# Patient Record
Sex: Male | Born: 1965
Health system: Southern US, Community
[De-identification: ages and names within clinical notes are randomized; demographics above are authoritative.]

## PROBLEM LIST (undated history)

## (undated) ENCOUNTER — Ambulatory Visit: Admission: EM | Source: Home / Self Care

## (undated) DIAGNOSIS — I4891 Unspecified atrial fibrillation: Secondary | ICD-10-CM

## (undated) DIAGNOSIS — Z789 Other specified health status: Secondary | ICD-10-CM

## (undated) DIAGNOSIS — K4090 Unilateral inguinal hernia, without obstruction or gangrene, not specified as recurrent: Secondary | ICD-10-CM

## (undated) HISTORY — PX: UPPER GI ENDOSCOPY: SHX6162

---

## 2005-04-03 HISTORY — PX: HERNIA REPAIR: SHX51

## 2005-12-17 ENCOUNTER — Encounter: Admission: RE | Admit: 2005-12-17 | Discharge: 2005-12-17 | Payer: Self-pay | Admitting: *Deleted

## 2006-02-07 ENCOUNTER — Encounter (INDEPENDENT_AMBULATORY_CARE_PROVIDER_SITE_OTHER): Payer: Self-pay | Admitting: Specialist

## 2006-02-07 ENCOUNTER — Ambulatory Visit (HOSPITAL_BASED_OUTPATIENT_CLINIC_OR_DEPARTMENT_OTHER): Admission: RE | Admit: 2006-02-07 | Discharge: 2006-02-07 | Payer: Self-pay | Admitting: Surgery

## 2007-09-01 ENCOUNTER — Encounter: Admission: RE | Admit: 2007-09-01 | Discharge: 2007-09-01 | Payer: Self-pay | Admitting: Diagnostic Radiology

## 2010-04-25 ENCOUNTER — Encounter: Payer: Self-pay | Admitting: Diagnostic Radiology

## 2010-08-19 NOTE — Op Note (Signed)
NAME:  Mark Fowler, Mark Fowler                ACCOUNT NO.:  [id number]   MEDICAL RECORD NO.:  [id number]          PATIENT TYPE:  AMB   LOCATION:  DSC                          FACILITY:  [facility]   PHYSICIAN:  Currie Paris, M.D.DATE OF BIRTH:  Feb 25, 1966   DATE OF PROCEDURE:  02/07/2006  DATE OF DISCHARGE:                                 OPERATIVE REPORT   PREOPERATIVE DIAGNOSIS:  Right inguinal hernia, probable indirect.   POSTOPERATIVE DIAGNOSIS:  Indirect right inguinal hernia.   PROCEDURE:  Repair of indirect right inguinal hernia with mesh.   SURGEON:  Currie Paris, M.D.   ANESTHESIA:  MAC.   CLINICAL HISTORY:  This patient presented with a recent onset of a right  inguinal bulge and hernia found on exam. He desired to proceed to have this  repaired.   DESCRIPTION OF PROCEDURE:  The patient was seen in the holding area and had  no further questions.  He confirmed that right inguinal hernia was the  operative plan.  Both he and I marked the right inguinal area as the  operative site.  The patient was then taken to the operating room and after  satisfactory IV sedation, the groin area was clipped, prepped and draped.  The time-out occurred.   1% Xylocaine with epi and 0.5% plain Marcaine were mixed equally and used  for local.  I infiltrated along the skin line, at right angles to it, both  transversely and vertically, and then above the anterior superior iliac  spine.  An oblique incision was made and deepened to the external oblique  aponeurosis with bleeders coagulated or tied with 4-0 Vicryl.  The external  oblique was cleaned off and then opened in the line of its fibers into the  superficial ring.  The cord was dissected up and elevated off of the cord  and surrounded with a Penrose drain.  What appeared to be the ilioinguinal  nerve was freed up and retracted temporarily superiorly.   As I cleaned off the cord, I saw that the floor was intact although very  thin.   There was a little bit of preperitoneal fat protruding through the  internal ring which was trimmed off and a small indirect sac which measured  about 3 cm by 4 cm.  This was twisted, suture ligated, amputated, and  retracted under the deep ring nicely.  I then took a piece of Marlex mesh  and cut it to form with a split laterally to go around the cord.  It was  sutured in starting at the pubic tubercle and using 2-0 Prolene to go from  there along the inferior edge until I got to the deep ring.  It lay well up  under the external oblique onto the internal oblique and was tacked with  several sutures there.  The opening for the cord structures was also used to  let the nerve come through so it would not be trapped by the mesh and I made  sure there was no mesh lying directly on it.  The tails went well lateral to  the  deep ring to give [place] a nice reconstruction of the deep ring.   Additional local had been infiltrated as I worked and the patient remained  comfortable.  I then close the external oblique with 3-0 Vicryl, Scarpa's  with 3-0 Vicryl, and the skin with 4-0 Monocryl subcuticular plus Dermabond.  The patient tolerated the procedure well.  There no operative complications.  All counts were correct.      Currie Paris, M.D.  Electronically Signed     CJS/MEDQ  D:  02/07/2006  T:  02/07/2006  Job:  [id number]

## 2012-05-24 ENCOUNTER — Other Ambulatory Visit (INDEPENDENT_AMBULATORY_CARE_PROVIDER_SITE_OTHER): Payer: Self-pay | Admitting: Surgery

## 2012-06-06 ENCOUNTER — Ambulatory Visit (INDEPENDENT_AMBULATORY_CARE_PROVIDER_SITE_OTHER): Payer: No Typology Code available for payment source | Admitting: Surgery

## 2012-06-06 ENCOUNTER — Encounter (INDEPENDENT_AMBULATORY_CARE_PROVIDER_SITE_OTHER): Payer: Self-pay | Admitting: Surgery

## 2012-06-06 VITALS — BP 120/82 | HR 60 | Temp 97.5°F | Resp 18 | Ht 72.0 in | Wt 169.0 lb

## 2012-06-06 DIAGNOSIS — K4090 Unilateral inguinal hernia, without obstruction or gangrene, not specified as recurrent: Secondary | ICD-10-CM

## 2012-06-06 HISTORY — DX: Unilateral inguinal hernia, without obstruction or gangrene, not specified as recurrent: K40.90

## 2012-06-06 NOTE — Progress Notes (Signed)
NAME: Mark Fowler DOB: 1965-11-03 MRN: [id number]                                                                                      DATE: 06/06/2012  PCP: Pearla Dubonnet, MD Referring Provider: No ref. provider found  IMPRESSION:  Left inguinial hernia  PLAN:   Repair - already scheduled. He understands and wishes toproceed                 CC:  Chief Complaint  Patient presents with  . Inguinal Hernia    left    HPI:  Mark Fowler is a 47 y.o.  male who presents for evaluation of LIH. It has been presnet several months and he now wishes to have it repaired. He notes that it is a bit larger than the one we repaired about seven years ago on the right side.  PMH:  has no past medical history on file.  PSH:   has no past surgical history on file.  ALLERGIES:  No Known Allergies  MEDICATIONS: Current outpatient prescriptions:Ibuprofen (ADVIL PO), Take by mouth as needed., Disp: , Rfl:   ROS: He has filled out our 12 point review of systems and it is negative . EXAM:   VITAL SIGNS: BP 120/82  Pulse 60  Temp(Src) 97.5 F (36.4 C) (Oral)  Resp 18  Ht 6' (1.829 m)  Wt 169 lb (76.658 kg)  BMI 22.92 kg/m2  GENERAL:  The patient is alert, oriented, and generally healthy-appearing, NAD. Mood and affect are normal.  HEENT:  The head is normocephalic, the eyes nonicteric, the pupils were round regular and equal. EOMs are normal. Pharynx normal. Dentition good.  NECK:  The neck is supple and there are no masses or thyromegaly.  LUNGS:  Normal respirations and clear to auscultation.  HEART:  Regular rhythm, with no murmurs rubs or gallops. Pulses are intact carotid dorsalis pedis and posterior tibial. No significant varicosities are noted.  ABDOMEN:  Soft, flat, and nontender. No masses or organomegaly is noted. No hernias are noted. Bowel sounds are normal.  GU:  Reducible left inguina hernia right ok  EXTREMITIES:  Good range of motion, no edema.   DATA  REVIEWED:  No new data    STRECK,CHRISTIAN J 06/06/2012  CC: No ref. provider found, GATES,ROBERT NEVILL, MD

## 2012-06-06 NOTE — Patient Instructions (Signed)
I will see you at surgery next month

## 2012-07-01 ENCOUNTER — Encounter (HOSPITAL_BASED_OUTPATIENT_CLINIC_OR_DEPARTMENT_OTHER): Payer: Self-pay | Admitting: *Deleted

## 2012-07-01 NOTE — Progress Notes (Signed)
No labs needed-radiologist

## 2012-07-04 ENCOUNTER — Encounter (HOSPITAL_BASED_OUTPATIENT_CLINIC_OR_DEPARTMENT_OTHER): Payer: Self-pay | Admitting: *Deleted

## 2012-07-04 ENCOUNTER — Encounter (HOSPITAL_BASED_OUTPATIENT_CLINIC_OR_DEPARTMENT_OTHER)
Admission: RE | Disposition: A | Discharge status: Home / Self Care | Payer: Self-pay | Source: Ambulatory Visit | Attending: Surgery

## 2012-07-04 ENCOUNTER — Ambulatory Visit (HOSPITAL_BASED_OUTPATIENT_CLINIC_OR_DEPARTMENT_OTHER)
Admission: RE | Admit: 2012-07-04 | Discharge: 2012-07-04 | Disposition: A | Discharge status: Home / Self Care | Payer: PRIVATE HEALTH INSURANCE | Source: Ambulatory Visit | Attending: Surgery | Admitting: Surgery

## 2012-07-04 ENCOUNTER — Ambulatory Visit (HOSPITAL_BASED_OUTPATIENT_CLINIC_OR_DEPARTMENT_OTHER): Payer: PRIVATE HEALTH INSURANCE | Admitting: *Deleted

## 2012-07-04 DIAGNOSIS — K4090 Unilateral inguinal hernia, without obstruction or gangrene, not specified as recurrent: Secondary | ICD-10-CM

## 2012-07-04 HISTORY — DX: Other specified health status: Z78.9

## 2012-07-04 HISTORY — PX: INGUINAL HERNIA REPAIR: SHX194

## 2012-07-04 HISTORY — DX: Unilateral inguinal hernia, without obstruction or gangrene, not specified as recurrent: K40.90

## 2012-07-04 LAB — POCT HEMOGLOBIN-HEMACUE: Hemoglobin: 16.3 g/dL (ref 13.0–17.0)

## 2012-07-04 SURGERY — REPAIR, HERNIA, INGUINAL, ADULT
Anesthesia: Regional | Site: Groin | Laterality: Left | Wound class: Clean

## 2012-07-04 MED ORDER — OXYCODONE HCL 5 MG PO TABS
5.0000 mg | ORAL_TABLET | Freq: Once | ORAL | Status: AC | PRN
  Administered 2012-07-04: 5 mg via ORAL

## 2012-07-04 MED ORDER — CEFAZOLIN SODIUM-DEXTROSE 2-3 GM-% IV SOLR
2.0000 g | INTRAVENOUS | Status: AC
  Administered 2012-07-04: 2 g via INTRAVENOUS

## 2012-07-04 MED ORDER — LACTATED RINGERS IV SOLN
INTRAVENOUS | Status: DC
  Administered 2012-07-04: 07:00:00 via INTRAVENOUS

## 2012-07-04 MED ORDER — CHLORHEXIDINE GLUCONATE 4 % EX LIQD: 1.0000 "application " | Freq: Once | CUTANEOUS | Status: DC

## 2012-07-04 MED ORDER — LIDOCAINE HCL (CARDIAC) 20 MG/ML IV SOLN
INTRAVENOUS | Status: DC | PRN
  Administered 2012-07-04: 60 mg via INTRAVENOUS

## 2012-07-04 MED ORDER — MIDAZOLAM HCL 2 MG/2ML IJ SOLN
1.0000 mg | INTRAMUSCULAR | Status: DC | PRN
  Administered 2012-07-04: 2 mg via INTRAVENOUS

## 2012-07-04 MED ORDER — DEXAMETHASONE SODIUM PHOSPHATE 4 MG/ML IJ SOLN
INTRAMUSCULAR | Status: DC | PRN
  Administered 2012-07-04: 10 mg via INTRAVENOUS

## 2012-07-04 MED ORDER — PROPOFOL 10 MG/ML IV BOLUS
INTRAVENOUS | Status: DC | PRN
  Administered 2012-07-04: 200 mg via INTRAVENOUS

## 2012-07-04 MED ORDER — HYDROMORPHONE HCL PF 1 MG/ML IJ SOLN: 0.2500 mg | INTRAMUSCULAR | Status: DC | PRN

## 2012-07-04 MED ORDER — ACETAMINOPHEN 10 MG/ML IV SOLN
1000.0000 mg | Freq: Once | INTRAVENOUS | Status: AC
  Administered 2012-07-04: 1000 mg via INTRAVENOUS

## 2012-07-04 MED ORDER — OXYCODONE HCL 5 MG/5ML PO SOLN: 5.0000 mg | Freq: Once | ORAL | Status: AC | PRN

## 2012-07-04 MED ORDER — BUPIVACAINE HCL (PF) 0.25 % IJ SOLN
INTRAMUSCULAR | Status: DC | PRN
  Administered 2012-07-04: 22 mL

## 2012-07-04 MED ORDER — FENTANYL CITRATE 0.05 MG/ML IJ SOLN
INTRAMUSCULAR | Status: DC | PRN
  Administered 2012-07-04: 100 ug via INTRAVENOUS

## 2012-07-04 MED ORDER — HYDROMORPHONE HCL 2 MG PO TABS: 2.0000 mg | ORAL_TABLET | ORAL | Status: DC | PRN

## 2012-07-04 MED ORDER — FENTANYL CITRATE 0.05 MG/ML IJ SOLN
50.0000 ug | INTRAMUSCULAR | Status: DC | PRN
  Administered 2012-07-04: 100 ug via INTRAVENOUS

## 2012-07-04 MED ORDER — BUPIVACAINE-EPINEPHRINE PF 0.5-1:200000 % IJ SOLN
INTRAMUSCULAR | Status: DC | PRN
  Administered 2012-07-04: 25 mL

## 2012-07-04 SURGICAL SUPPLY — 40 items
ADH SKN CLS APL DERMABOND .7 (GAUZE/BANDAGES/DRESSINGS) ×1
BLADE HEX COATED 2.75 (ELECTRODE) ×2 IMPLANT
BLADE SURG 10 STRL SS (BLADE) ×2 IMPLANT
BLADE SURG ROTATE 9660 (MISCELLANEOUS) ×2 IMPLANT
CHLORAPREP W/TINT 26ML (MISCELLANEOUS) ×2 IMPLANT
CLOTH BEACON ORANGE TIMEOUT ST (SAFETY) ×2 IMPLANT
COVER MAYO STAND STRL (DRAPES) ×2 IMPLANT
COVER TABLE BACK 60X90 (DRAPES) ×2 IMPLANT
DECANTER SPIKE VIAL GLASS SM (MISCELLANEOUS) IMPLANT
DERMABOND ADVANCED (GAUZE/BANDAGES/DRESSINGS) ×1
DERMABOND ADVANCED .7 DNX12 (GAUZE/BANDAGES/DRESSINGS) ×1 IMPLANT
DRAIN PENROSE 1/2X12 LTX STRL (WOUND CARE) ×2 IMPLANT
DRAPE LAPAROTOMY TRNSV 102X78 (DRAPE) ×2 IMPLANT
DRAPE UTILITY XL STRL (DRAPES) ×2 IMPLANT
ELECT REM PT RETURN 9FT ADLT (ELECTROSURGICAL) ×2
ELECTRODE REM PT RTRN 9FT ADLT (ELECTROSURGICAL) ×1 IMPLANT
GLOVE ECLIPSE 6.5 STRL STRAW (GLOVE) ×1 IMPLANT
GLOVE EUDERMIC 7 POWDERFREE (GLOVE) ×2 IMPLANT
GLOVE INDICATOR 6.5 STRL GRN (GLOVE) ×1 IMPLANT
GOWN PREVENTION PLUS XLARGE (GOWN DISPOSABLE) ×4 IMPLANT
MESH HERNIA 3X6 (Mesh General) ×1 IMPLANT
NDL HYPO 25X1 1.5 SAFETY (NEEDLE) ×1 IMPLANT
NEEDLE HYPO 22GX1.5 SAFETY (NEEDLE) ×2 IMPLANT
NEEDLE HYPO 25X1 1.5 SAFETY (NEEDLE) ×2 IMPLANT
NS IRRIG 1000ML POUR BTL (IV SOLUTION) ×1 IMPLANT
PACK BASIN DAY SURGERY FS (CUSTOM PROCEDURE TRAY) ×2 IMPLANT
PENCIL BUTTON HOLSTER BLD 10FT (ELECTRODE) ×2 IMPLANT
SLEEVE SCD COMPRESS KNEE MED (MISCELLANEOUS) ×1 IMPLANT
SPONGE INTESTINAL PEANUT (DISPOSABLE) ×2 IMPLANT
SPONGE LAP 4X18 X RAY DECT (DISPOSABLE) ×2 IMPLANT
SUT MNCRL AB 4-0 PS2 18 (SUTURE) ×2 IMPLANT
SUT PROLENE 2 0 CT2 30 (SUTURE) ×4 IMPLANT
SUT SILK 2 0 SH (SUTURE) IMPLANT
SUT VIC AB 3-0 CT1 27 (SUTURE) ×4
SUT VIC AB 3-0 CT1 27XBRD (SUTURE) ×2 IMPLANT
SUT VIC AB 4-0 BRD 54 (SUTURE) ×2 IMPLANT
SYR CONTROL 10ML LL (SYRINGE) ×2 IMPLANT
TAPE HYPAFIX 4 X10 (GAUZE/BANDAGES/DRESSINGS) IMPLANT
TOWEL OR 17X24 6PK STRL BLUE (TOWEL DISPOSABLE) ×2 IMPLANT
TOWEL OR NON WOVEN STRL DISP B (DISPOSABLE) ×2 IMPLANT

## 2012-07-04 NOTE — Op Note (Signed)
Mark Fowler 11/19/1965 [id number] 05/29/2012  Preoperative diagnosis: LIH, indirect  Postoperative diagnosis: Same  Procedure: Repair LIH with mesh  Surgeon: Currie Paris, MD, FACS  Anesthesia:General  Clinical History and Indications: This patient has a left inguinal hernia that he wishes to have repaired  Description of Procedure:The patient was seen in the holding area and the plans for the procedure as noted above confirmed with the patient. We reviewed again the risks and complications and the patient had no further questions. I then marked the left  as the operative side. This was confirmed with the patient. He wishes to proceed.  The patient was taken to the operating room and after satisfactory general anesthesia was obtained the left  inguinal area was prepped and draped as a sterile field. A time out was done.   A TAP block had been placed and I used some 0.25% plaine marcaine to help with postoperative pain management. The area of the incision was infiltrated first as well as a field block going medially and inferiorly  An oblique incision was made and deepened to the external oblique aponeurosis. Bleeders were either cauterized or tied with 3-0 Vicryl. The external oblique aponeurosis was opened in the line of its fibers and elevated off of the underlying tissue. The ilioinguinal nerve was noted and protected.  The spermatic cord was then dissected up off of the inguinal floor and surrounded with a drain. The ilioinguinal neerve was identified and kept with the cord. There was an indirect sac present, and there ws some thinning of the inguinal floor. The sac was dissected off the cord, opened, then tied with 4-0 Vircryl and amputated. It retracted under the deep ring.  I then took some Bard mesh and cut it to shape. It was anchored at the pubic tubercle and a running 2-0 Prolene used to suture it to the edge of the inferior shelving edge of the external oblique. It was  split laterally to go around the cord and then laid gently over the internal oblique medially. Several more sutures of 2-0 Prolene were used to anchor the mesh to the internal oblique. The tails of the mesh were crossed lateral to the cord structures and deep ring and tacked together.  This appeared to produce a nice coverage and repair with no tension. There was adequate space for the cord structures to exit through the mesh and deep ring.  I checked to make sure everything was dry. Additional local had been infiltrated as I was working to be sure we had complete anesthesia of the entire operative field.  The incision was then closed with a running 3-0 Vicryl on the external oblique, closing it over the repair. Scarpa's fascia was closed with a running 3-0 Vicryl and the skin with a running 4-0 Monocryl subcuticular and Dermabond on the skin.  The patient tolerated the procedure well. There were no operative complications. There was minimal blood loss. All counts were correct.  Currie Paris, MD, FACS 07/04/2012 8:30 AM

## 2012-07-04 NOTE — Anesthesia Procedure Notes (Addendum)
Anesthesia Regional Block:  TAP block  Pre-Anesthetic Checklist: ,, timeout performed, Correct Patient, Correct Site, Correct Laterality, Correct Procedure, Correct Position, site marked, Risks and benefits discussed, pre-op evaluation, post-op pain management  Laterality: Left  Prep: chloraprep       Needles:  Injection technique: Single-shot  Needle Type: Echogenic Stimulator Needle          Additional Needles:  Procedures: ultrasound guided (picture in chart) TAP block Narrative:  Start time: 07/04/2012 7:07 AM End time: 07/04/2012 7:15 AM Injection made incrementally with aspirations every 5 mL.  Performed by: Personally  Anesthesiologist: Fitzgerald,MD  Additional Notes: 2% Lidocaine for skin.   Procedure Name: LMA Insertion Date/Time: 07/04/2012 7:30 AM Performed by: Meyer Russel Pre-anesthesia Checklist: Patient identified, Emergency Drugs available, Suction available and Patient being monitored Patient Re-evaluated:Patient Re-evaluated prior to inductionOxygen Delivery Method: Circle System Utilized Preoxygenation: Pre-oxygenation with 100% oxygen Intubation Type: IV induction Ventilation: Mask ventilation without difficulty LMA: LMA inserted LMA Size: 5.0 Number of attempts: 1 Airway Equipment and Method: bite block Placement Confirmation: positive ETCO2 and breath sounds checked- equal and bilateral Tube secured with: Tape Dental Injury: Teeth and Oropharynx as per pre-operative assessment

## 2012-07-04 NOTE — Progress Notes (Signed)
Assisted Dr. Fitzgerald with left, ultrasound guided, transabdominal plane block. Side rails up, monitors on throughout procedure. See vital signs in flow sheet. Tolerated Procedure well. 

## 2012-07-04 NOTE — Interval H&P Note (Signed)
History and Physical Interval Note:  07/04/2012 7:18 AM  Mark Fowler  has presented today for surgery, with the diagnosis of left inguinal hernia   The various methods of treatment have been discussed with the patient and family. After consideration of risks, benefits and other options for treatment, the patient has consented to  Procedure(s): HERNIA REPAIR INGUINAL ADULT (Left) as a surgical intervention .  The patient's history has been reviewed, patient examined, no change in status, stable for surgery.  I have reviewed the patient's chart and labs.  Questions were answered to the patient's satisfaction.   TEP block is in, left inguinal area marked as the operative site  Shelbi Vaccaro J

## 2012-07-04 NOTE — Transfer of Care (Signed)
Immediate Anesthesia Transfer of Care Note  Patient: Mark Fowler  Procedure(s) Performed: Procedure(s): HERNIA REPAIR INGUINAL ADULT (Left)  Patient Location: PACU  Anesthesia Type:GA combined with regional for post-op pain  Level of Consciousness: awake, alert  and oriented  Airway & Oxygen Therapy: Patient Spontanous Breathing and Patient connected to face mask oxygen  Post-op Assessment: Report given to PACU RN, Post -op Vital signs reviewed and stable and Patient moving all extremities  Post vital signs: Reviewed and stable  Complications: No apparent anesthesia complications

## 2012-07-04 NOTE — H&P (View-Only) (Signed)
NAME: Mark Fowler DOB: 08-Apr-1965 MRN: [id number]                                                                                      DATE: 06/06/2012  PCP: Pearla Dubonnet, MD Referring Provider: No ref. provider found  IMPRESSION:  Left inguinial hernia  PLAN:   Repair - already scheduled. He understands and wishes toproceed                 CC:  Chief Complaint  Patient presents with  . Inguinal Hernia    left    HPI:  Mark Fowler is a 47 y.o.  male who presents for evaluation of LIH. It has been presnet several months and he now wishes to have it repaired. He notes that it is a bit larger than the one we repaired about seven years ago on the right side.  PMH:  has no past medical history on file.  PSH:   has no past surgical history on file.  ALLERGIES:  No Known Allergies  MEDICATIONS: Current outpatient prescriptions:Ibuprofen (ADVIL PO), Take by mouth as needed., Disp: , Rfl:   ROS: He has filled out our 12 point review of systems and it is negative . EXAM:   VITAL SIGNS: BP 120/82  Pulse 60  Temp(Src) 97.5 F (36.4 C) (Oral)  Resp 18  Ht 6' (1.829 m)  Wt 169 lb (76.658 kg)  BMI 22.92 kg/m2  GENERAL:  The patient is alert, oriented, and generally healthy-appearing, NAD. Mood and affect are normal.  HEENT:  The head is normocephalic, the eyes nonicteric, the pupils were round regular and equal. EOMs are normal. Pharynx normal. Dentition good.  NECK:  The neck is supple and there are no masses or thyromegaly.  LUNGS:  Normal respirations and clear to auscultation.  HEART:  Regular rhythm, with no murmurs rubs or gallops. Pulses are intact carotid dorsalis pedis and posterior tibial. No significant varicosities are noted.  ABDOMEN:  Soft, flat, and nontender. No masses or organomegaly is noted. No hernias are noted. Bowel sounds are normal.  GU:  Reducible left inguina hernia right ok  EXTREMITIES:  Good range of motion, no edema.   DATA  REVIEWED:  No new data    STRECK,CHRISTIAN J 06/06/2012  CC: No ref. provider found, GATES,ROBERT NEVILL, MD

## 2012-07-04 NOTE — Anesthesia Postprocedure Evaluation (Signed)
  Anesthesia Post-op Note  Patient: Mark Fowler  Procedure(s) Performed: Procedure(s): HERNIA REPAIR INGUINAL ADULT (Left)  Patient Location: PACU  Anesthesia Type:GA combined with regional for post-op pain  Level of Consciousness: awake, alert  and oriented  Airway and Oxygen Therapy: Patient Spontanous Breathing  Post-op Pain: none  Post-op Assessment: Post-op Vital signs reviewed, Patient's Cardiovascular Status Stable, Respiratory Function Stable, Patent Airway and No signs of Nausea or vomiting  Post-op Vital Signs: Reviewed and stable  Complications: No apparent anesthesia complications

## 2012-07-04 NOTE — Anesthesia Preprocedure Evaluation (Addendum)
Anesthesia Evaluation  Patient identified by MRN, date of birth, ID band Patient awake    Reviewed: Allergy & Precautions, H&P , NPO status , Patient's Chart, lab work & pertinent test results  Airway Mallampati: II TM Distance: >3 FB Neck ROM: Full    Dental no notable dental hx. (+) Teeth Intact and Dental Advisory Given   Pulmonary neg pulmonary ROS,  breath sounds clear to auscultation  Pulmonary exam normal       Cardiovascular negative cardio ROS  Rhythm:Regular Rate:Normal     Neuro/Psych negative neurological ROS  negative psych ROS   GI/Hepatic negative GI ROS, Neg liver ROS,   Endo/Other  negative endocrine ROS  Renal/GU negative Renal ROS  negative genitourinary   Musculoskeletal   Abdominal   Peds  Hematology negative hematology ROS (+)   Anesthesia Other Findings   Reproductive/Obstetrics negative OB ROS                          Anesthesia Physical Anesthesia Plan  ASA: I  Anesthesia Plan: General and Regional   Post-op Pain Management:    Induction: Intravenous  Airway Management Planned: LMA  Additional Equipment:   Intra-op Plan:   Post-operative Plan: Extubation in OR  Informed Consent: I have reviewed the patients History and Physical, chart, labs and discussed the procedure including the risks, benefits and alternatives for the proposed anesthesia with the patient or authorized representative who has indicated his/her understanding and acceptance.   Dental advisory given  Plan Discussed with: CRNA  Anesthesia Plan Comments:         Anesthesia Quick Evaluation  

## 2012-07-05 ENCOUNTER — Encounter (HOSPITAL_BASED_OUTPATIENT_CLINIC_OR_DEPARTMENT_OTHER): Payer: Self-pay | Admitting: Surgery

## 2012-07-26 ENCOUNTER — Encounter (INDEPENDENT_AMBULATORY_CARE_PROVIDER_SITE_OTHER): Payer: No Typology Code available for payment source | Admitting: Surgery

## 2012-07-30 ENCOUNTER — Ambulatory Visit (INDEPENDENT_AMBULATORY_CARE_PROVIDER_SITE_OTHER): Payer: No Typology Code available for payment source | Admitting: Surgery

## 2012-07-30 ENCOUNTER — Encounter (INDEPENDENT_AMBULATORY_CARE_PROVIDER_SITE_OTHER): Payer: Self-pay | Admitting: Surgery

## 2012-07-30 VITALS — BP 118/82 | HR 65 | Temp 98.0°F | Resp 18 | Ht 72.0 in | Wt 175.0 lb

## 2012-07-30 DIAGNOSIS — Z09 Encounter for follow-up examination after completed treatment for conditions other than malignant neoplasm: Secondary | ICD-10-CM

## 2012-07-30 NOTE — Patient Instructions (Signed)
We will see you again on an as needed basis. Please call the office at [phone] if you have any questions or concerns. Thank you for allowing us to take care of you.  

## 2012-07-30 NOTE — Progress Notes (Signed)
NAME: Mark Fowler                                            DOB: May 19, 1965 DATE: 07/30/2012                                                  MRN: [id number]  CC:  Chief Complaint  Patient presents with  . Routine Post Op    L/H    HPI: This patient comes in for post op follow-up .Heunderwent left inguinal hernia repair on 07/05/2011. He feels that he is doing well.  PE:  VITAL SIGNS: BP 118/82  Pulse 65  Temp(Src) 98 F (36.7 C)  Resp 18  Ht 6' (1.829 m)  Wt 175 lb (79.379 kg)  BMI 23.73 kg/m2  General: The patient appears to be healthy, NAD Incision: Incision is healing nicely. Repair is solid. The testis is normal.  IMPRESSION: The patient is doing well S/P repair of a left inguinal hernia.    PLAN: I will see him back when necessary. He'll gradually increase activity to a normal level.

## 2013-02-06 ENCOUNTER — Other Ambulatory Visit: Payer: Self-pay

## 2014-01-16 ENCOUNTER — Other Ambulatory Visit: Payer: Self-pay

## 2014-02-17 ENCOUNTER — Other Ambulatory Visit: Payer: Self-pay | Admitting: Plastic Surgery

## 2017-11-30 ENCOUNTER — Other Ambulatory Visit: Payer: Self-pay

## 2017-11-30 ENCOUNTER — Encounter (HOSPITAL_BASED_OUTPATIENT_CLINIC_OR_DEPARTMENT_OTHER): Payer: Self-pay | Admitting: Emergency Medicine

## 2017-11-30 ENCOUNTER — Emergency Department (HOSPITAL_BASED_OUTPATIENT_CLINIC_OR_DEPARTMENT_OTHER)
Admission: EM | Admit: 2017-11-30 | Discharge: 2017-11-30 | Disposition: A | Discharge status: Home / Self Care | Payer: No Typology Code available for payment source | Attending: Emergency Medicine | Admitting: Emergency Medicine

## 2017-11-30 DIAGNOSIS — M79605 Pain in left leg: Secondary | ICD-10-CM | POA: Diagnosis present

## 2017-11-30 DIAGNOSIS — M5432 Sciatica, left side: Secondary | ICD-10-CM | POA: Insufficient documentation

## 2017-11-30 MED ORDER — CYCLOBENZAPRINE HCL 10 MG PO TABS: 10.0000 mg | ORAL_TABLET | Freq: Three times a day (TID) | ORAL | 0 refills | Status: DC | PRN

## 2017-11-30 MED ORDER — PREDNISONE 10 MG PO TABS: 20.0000 mg | ORAL_TABLET | Freq: Two times a day (BID) | ORAL | 0 refills | Status: DC

## 2017-11-30 NOTE — Discharge Instructions (Addendum)
Prednisone as prescribed.  Ibuprofen 600 mg every 6 hours as needed for pain.  Flexeril as prescribed as needed for pain not relieved with ibuprofen.  All up with your primary doctor if symptoms are not improving in the next week to discuss physical therapy or possible imaging studies.

## 2017-11-30 NOTE — ED Provider Notes (Signed)
[facility] EMERGENCY DEPARTMENT Provider Note   CSN: [id number] Arrival date & time: 11/30/17  0536     History   Chief Complaint Chief Complaint  Patient presents with  . Leg Pain    HPI Mark Fowler is a 52 y.o. male.  Patient is a 52 year old male with no significant past medical history.  He presents today with complaints of pain in his left buttock and left leg.  This is been occurring intermittently for the past several days, however became severe this morning.  He describes pain that starts in his left buttock and radiates into his left leg.  He denies any specific injury or trauma.  He denies any bowel or bladder complaints and denies any weakness.  He took Flexeril that he had leftover from a prior prescription which gave him some relief.  He reports a similar episode in the same leg last fall which resolved spontaneously, however was not quite this bad.  The history is provided by the patient.  Leg Pain   This is a new problem. Episode onset: Several days ago. The problem occurs constantly. The problem has been rapidly worsening. Pain location: Left buttock and left leg. The pain is severe. Associated symptoms include limited range of motion and stiffness. Pertinent negatives include no numbness and no tingling. Treatments tried: Advil and Flexeril. The treatment provided moderate relief.    Past Medical History:  Diagnosis Date  . Left inguinal hernia 06/06/2012   Repaired 07/04/12   . Medical history non-contributory     There are no active problems to display for this patient.   Past Surgical History:  Procedure Laterality Date  . HERNIA REPAIR  2007   rt ing hernia with mesh  . INGUINAL HERNIA REPAIR Left 07/04/2012   Procedure: HERNIA REPAIR INGUINAL ADULT;  Surgeon: Currie Paris, MD;  Location: [facility];  Service: General;  Laterality: Left;  . UPPER GI ENDOSCOPY          Home Medications    Prior to Admission medications    Medication Sig Start Date End Date Taking? Authorizing Provider  desloratadine-pseudoephedrine (CLARINEX-D 12-HOUR) 2.5-120 MG per tablet Take 1 tablet by mouth 2 (two) times daily.    [provider]  HYDROmorphone (DILAUDID) 2 MG tablet Take 1 tablet (2 mg total) by mouth every 4 (four) hours as needed for pain. Take as needed for pain 07/04/12   Cyndia Bent, MD  Ibuprofen (ADVIL PO) Take by mouth as needed.    [provider]    Family History History reviewed. No pertinent family history.  Social History Social History   Tobacco Use  . Smoking status: Never Smoker  . Smokeless tobacco: Never Used  Substance Use Topics  . Alcohol use: Yes    Alcohol/week: 7.0 - 14.0 standard drinks    Types: 7 - 14 Standard drinks or equivalent per week  . Drug use: No     Allergies   Patient has no known allergies.   Review of Systems Review of Systems  Musculoskeletal: Positive for stiffness.  Neurological: Negative for tingling and numbness.  All other systems reviewed and are negative.    Physical Exam Updated Vital Signs BP 133/90 (BP Location: Left Arm)   Pulse 70   Temp 98.4 F (36.9 C) (Oral)   Resp 16   Ht 6' (1.829 m)   Wt 75.8 kg   SpO2 100%   BMI 22.65 kg/m   Physical Exam  Constitutional: He  is oriented to person, place, and time. He appears well-developed and well-nourished. No distress.  HENT:  Head: Normocephalic and atraumatic.  Neck: Normal range of motion. Neck supple.  Pulmonary/Chest: Effort normal.  Musculoskeletal:  There is tenderness to palpation in the left buttock.  There is no L-spine tenderness or tenderness in the soft tissues of the lumbar region.  Neurological: He is alert and oriented to person, place, and time.  DTRs are 2+ and symmetrical in the patellar and Achilles tendons bilaterally.  Strength is 5 out of 5 in both lower extremities.  He is able to walk on his heels and toes without difficulty.  Skin: Skin is  warm and dry. He is not diaphoretic.  Nursing note and vitals reviewed.    ED Treatments / Results  Labs (all labs ordered are listed, but only abnormal results are displayed) Labs Reviewed - No data to display  EKG None  Radiology No results found.  Procedures Procedures (including critical care time)  Medications Ordered in ED Medications - No data to display   Initial Impression / Assessment and Plan / ED Course  I have reviewed the triage vital signs and the nursing notes.  Pertinent labs & imaging results that were available during my care of the patient were reviewed by me and considered in my medical decision making (see chart for details).  This appears to be sciatica.  He has no findings on exam and there are no aspects of his history that raise any red flags that there is an emergently surgical condition present.  He is feeling better after taking Flexeril prior to coming here.  He will be discharged with prednisone and prescribed additional Flexeril.  He is to follow-up with his primary doctor if symptoms persist to discuss imaging studies or physical therapy.  Final Clinical Impressions(s) / ED Diagnoses   Final diagnoses:  None    ED Discharge Orders    None       Geoffery Lyonselo, Trayonna Bachmeier, MD 11/30/17 [phone]

## 2017-11-30 NOTE — ED Triage Notes (Signed)
Patient co left buttock pain radiating into left lower extremity; states intermittent numbness and cooling sensations in left lower extremity. States took tylenol and flexeril at 0100 and ibuprofen at 0300; states pain is relieved at this time.

## 2017-12-04 ENCOUNTER — Ambulatory Visit
Admission: RE | Admit: 2017-12-04 | Discharge: 2017-12-04 | Disposition: A | Discharge status: Home / Self Care | Payer: No Typology Code available for payment source | Source: Ambulatory Visit | Attending: Neurosurgery | Admitting: Neurosurgery

## 2017-12-04 ENCOUNTER — Other Ambulatory Visit: Payer: Self-pay | Admitting: Neurosurgery

## 2017-12-04 DIAGNOSIS — M545 Low back pain, unspecified: Secondary | ICD-10-CM

## 2017-12-07 ENCOUNTER — Other Ambulatory Visit: Payer: Self-pay | Admitting: Neurosurgery

## 2017-12-07 DIAGNOSIS — M5126 Other intervertebral disc displacement, lumbar region: Secondary | ICD-10-CM

## 2017-12-11 ENCOUNTER — Other Ambulatory Visit: Payer: Self-pay | Admitting: Neurosurgery

## 2017-12-11 ENCOUNTER — Ambulatory Visit
Admission: RE | Admit: 2017-12-11 | Discharge: 2017-12-11 | Disposition: A | Discharge status: Home / Self Care | Payer: No Typology Code available for payment source | Source: Ambulatory Visit | Attending: Neurosurgery | Admitting: Neurosurgery

## 2017-12-11 DIAGNOSIS — M5126 Other intervertebral disc displacement, lumbar region: Secondary | ICD-10-CM

## 2017-12-11 MED ORDER — IOPAMIDOL (ISOVUE-M 200) INJECTION 41%
1.0000 mL | Freq: Once | INTRAMUSCULAR | Status: AC
  Administered 2017-12-11: 1 mL via EPIDURAL

## 2017-12-11 MED ORDER — METHYLPREDNISOLONE ACETATE 40 MG/ML INJ SUSP (RADIOLOG
120.0000 mg | Freq: Once | INTRAMUSCULAR | Status: AC
  Administered 2017-12-11: 120 mg via EPIDURAL

## 2017-12-11 NOTE — Discharge Instructions (Signed)

## 2018-06-11 ENCOUNTER — Other Ambulatory Visit: Payer: Self-pay | Admitting: Internal Medicine

## 2018-06-11 ENCOUNTER — Ambulatory Visit
Admission: RE | Admit: 2018-06-11 | Discharge: 2018-06-11 | Disposition: A | Discharge status: Home / Self Care | Payer: BLUE CROSS/BLUE SHIELD | Source: Ambulatory Visit | Attending: Internal Medicine | Admitting: Internal Medicine

## 2018-06-11 DIAGNOSIS — M79672 Pain in left foot: Secondary | ICD-10-CM

## 2018-06-11 DIAGNOSIS — S99922A Unspecified injury of left foot, initial encounter: Secondary | ICD-10-CM | POA: Diagnosis not present

## 2018-08-19 ENCOUNTER — Emergency Department (HOSPITAL_BASED_OUTPATIENT_CLINIC_OR_DEPARTMENT_OTHER)
Admission: EM | Admit: 2018-08-19 | Discharge: 2018-08-19 | Disposition: A | Discharge status: Home / Self Care | Payer: BLUE CROSS/BLUE SHIELD | Attending: Emergency Medicine | Admitting: Emergency Medicine

## 2018-08-19 ENCOUNTER — Emergency Department (HOSPITAL_BASED_OUTPATIENT_CLINIC_OR_DEPARTMENT_OTHER): Payer: BLUE CROSS/BLUE SHIELD

## 2018-08-19 ENCOUNTER — Encounter (HOSPITAL_BASED_OUTPATIENT_CLINIC_OR_DEPARTMENT_OTHER): Payer: Self-pay | Admitting: Emergency Medicine

## 2018-08-19 ENCOUNTER — Other Ambulatory Visit: Payer: Self-pay

## 2018-08-19 DIAGNOSIS — N23 Unspecified renal colic: Secondary | ICD-10-CM | POA: Diagnosis not present

## 2018-08-19 DIAGNOSIS — N200 Calculus of kidney: Secondary | ICD-10-CM | POA: Insufficient documentation

## 2018-08-19 DIAGNOSIS — Z79899 Other long term (current) drug therapy: Secondary | ICD-10-CM | POA: Insufficient documentation

## 2018-08-19 DIAGNOSIS — R1011 Right upper quadrant pain: Secondary | ICD-10-CM | POA: Diagnosis not present

## 2018-08-19 DIAGNOSIS — N1330 Unspecified hydronephrosis: Secondary | ICD-10-CM | POA: Diagnosis not present

## 2018-08-19 LAB — CBC WITH DIFFERENTIAL/PLATELET
Abs Immature Granulocytes: 0.03 10*3/uL (ref 0.00–0.07)
Basophils Absolute: 0.1 10*3/uL (ref 0.0–0.1)
Basophils Relative: 1 %
Eosinophils Absolute: 0.3 10*3/uL (ref 0.0–0.5)
Eosinophils Relative: 4 %
HCT: 46.6 % (ref 39.0–52.0)
Hemoglobin: 15.4 g/dL (ref 13.0–17.0)
Immature Granulocytes: 0 %
Lymphocytes Relative: 21 %
Lymphs Abs: 1.6 10*3/uL (ref 0.7–4.0)
MCH: 31 pg (ref 26.0–34.0)
MCHC: 33 g/dL (ref 30.0–36.0)
MCV: 93.8 fL (ref 80.0–100.0)
Monocytes Absolute: 0.4 10*3/uL (ref 0.1–1.0)
Monocytes Relative: 5 %
Neutro Abs: 5.2 10*3/uL (ref 1.7–7.7)
Neutrophils Relative %: 69 %
Platelets: 201 10*3/uL (ref 150–400)
RBC: 4.97 MIL/uL (ref 4.22–5.81)
RDW: 12.2 % (ref 11.5–15.5)
WBC: 7.5 10*3/uL (ref 4.0–10.5)
nRBC: 0 % (ref 0.0–0.2)

## 2018-08-19 LAB — BASIC METABOLIC PANEL
Anion gap: 10 (ref 5–15)
BUN: 20 mg/dL (ref 6–20)
CO2: 24 mmol/L (ref 22–32)
Calcium: 9 mg/dL (ref 8.9–10.3)
Chloride: 107 mmol/L (ref 98–111)
Creatinine, Ser: 0.91 mg/dL (ref 0.61–1.24)
GFR calc Af Amer: >60 mL/min (ref >60)
GFR calc non Af Amer: >60 mL/min (ref >60)
Glucose, Bld: 124 mg/dL — ABNORMAL HIGH (ref 70–99)
Potassium: 3.4 mmol/L — ABNORMAL LOW (ref 3.5–5.1)
Sodium: 141 mmol/L (ref 135–145)

## 2018-08-19 LAB — URINALYSIS, ROUTINE W REFLEX MICROSCOPIC
Bilirubin Urine: NEGATIVE
Glucose, UA: NEGATIVE mg/dL
Ketones, ur: NEGATIVE mg/dL
Leukocytes,Ua: NEGATIVE
Nitrite: NEGATIVE
Protein, ur: NEGATIVE mg/dL
Specific Gravity, Urine: >1.03 — ABNORMAL HIGH (ref 1.005–1.030)
pH: 5.5 (ref 5.0–8.0)

## 2018-08-19 LAB — URINALYSIS, MICROSCOPIC (REFLEX)

## 2018-08-19 MED ORDER — TAMSULOSIN HCL 0.4 MG PO CAPS: 0.4000 mg | ORAL_CAPSULE | Freq: Every day | ORAL | 0 refills | Status: DC

## 2018-08-19 MED ORDER — IBUPROFEN 600 MG PO TABS: 600.0000 mg | ORAL_TABLET | Freq: Four times a day (QID) | ORAL | 0 refills | Status: DC | PRN

## 2018-08-19 MED ORDER — HYDROCODONE-ACETAMINOPHEN 5-325 MG PO TABS: 1.0000 | ORAL_TABLET | Freq: Four times a day (QID) | ORAL | 0 refills | Status: DC | PRN

## 2018-08-19 MED ORDER — ONDANSETRON HCL 4 MG/2ML IJ SOLN
4.0000 mg | Freq: Once | INTRAMUSCULAR | Status: AC
  Administered 2018-08-19: 06:00:00 4 mg via INTRAVENOUS
  Filled 2018-08-19: qty 2

## 2018-08-19 MED ORDER — KETOROLAC TROMETHAMINE 30 MG/ML IJ SOLN
30.0000 mg | Freq: Once | INTRAMUSCULAR | Status: AC
  Administered 2018-08-19: 06:00:00 30 mg via INTRAVENOUS
  Filled 2018-08-19: qty 1

## 2018-08-19 MED ORDER — SODIUM CHLORIDE 0.9 % IV BOLUS
1000.0000 mL | Freq: Once | INTRAVENOUS | Status: AC
  Administered 2018-08-19: 1000 mL via INTRAVENOUS

## 2018-08-19 MED ORDER — ONDANSETRON 4 MG PO TBDP: 4.0000 mg | ORAL_TABLET | Freq: Three times a day (TID) | ORAL | 0 refills | Status: DC | PRN

## 2018-08-19 NOTE — ED Notes (Signed)
Pt understood dc material. NAD noted. Scripts given at dc. All questions answered to satisfaction. Pt escorted to check out counter. 

## 2018-08-19 NOTE — ED Notes (Signed)
ED Provider at bedside. 

## 2018-08-19 NOTE — ED Triage Notes (Signed)
Pt states he woke up at 0340 and urinated. At 0400 he felt that he needed to pee. Then right flank pain started. Felt nauseous and sweaty.

## 2018-08-19 NOTE — ED Provider Notes (Signed)
[facility] EMERGENCY DEPARTMENT Provider Note   CSN: [id number] Arrival date & time: 08/19/18  0549    History   Chief Complaint Chief Complaint  Patient presents with  . Flank Pain    HPI Mark Fowler is a 53 y.o. male.     HPI  This is a 53 year old male with no significant past medical history who presents with right flank pain.  Patient reports that he got up this morning to urinate.  He went back to bed and had a sudden onset of right flank pain and urinary urgency.  He states the pain comes and goes.  Nothing seems to make it better or worse.  It did begin to radiate into his right testicle.  At times the pain is as high as 7 out of 10.  Currently the pain is 3 out of 10.  He did not take anything for the pain.  He reports nausea without vomiting.  Denies fevers or dysuria.  No history of kidney stones.  Of note, patient is a Occupational psychologistlocal radiologist.  Past Medical History:  Diagnosis Date  . Left inguinal hernia 06/06/2012   Repaired 07/04/12   . Medical history non-contributory     There are no active problems to display for this patient.   Past Surgical History:  Procedure Laterality Date  . HERNIA REPAIR  2007   rt ing hernia with mesh  . INGUINAL HERNIA REPAIR Left 07/04/2012   Procedure: HERNIA REPAIR INGUINAL ADULT;  Surgeon: Currie Parishristian J Streck, MD;  Location: [facility];  Service: General;  Laterality: Left;  . UPPER GI ENDOSCOPY          Home Medications    Prior to Admission medications   Medication Sig Start Date End Date Taking? Authorizing Provider  cyclobenzaprine (FLEXERIL) 10 MG tablet Take 1 tablet (10 mg total) by mouth 3 (three) times daily as needed for muscle spasms. 11/30/17   Geoffery Lyonselo, Douglas, MD  desloratadine-pseudoephedrine (CLARINEX-D 12-HOUR) 2.5-120 MG per tablet Take 1 tablet by mouth 2 (two) times daily.    [provider]  HYDROcodone-acetaminophen (NORCO/VICODIN) 5-325 MG tablet Take 1 tablet by mouth every 6  (six) hours as needed. 08/19/18   Horton, Mayer Maskerourtney F, MD  HYDROmorphone (DILAUDID) 2 MG tablet Take 1 tablet (2 mg total) by mouth every 4 (four) hours as needed for pain. Take as needed for pain 07/04/12   Cyndia BentStreck, Christian, MD  ibuprofen (ADVIL) 600 MG tablet Take 1 tablet (600 mg total) by mouth every 6 (six) hours as needed. 08/19/18   Horton, Mayer Maskerourtney F, MD  ondansetron (ZOFRAN ODT) 4 MG disintegrating tablet Take 1 tablet (4 mg total) by mouth every 8 (eight) hours as needed. 08/19/18   Horton, Mayer Maskerourtney F, MD  predniSONE (DELTASONE) 10 MG tablet Take 2 tablets (20 mg total) by mouth 2 (two) times daily. 11/30/17   Geoffery Lyonselo, Douglas, MD  tamsulosin (FLOMAX) 0.4 MG CAPS capsule Take 1 capsule (0.4 mg total) by mouth daily. 08/19/18   Horton, Mayer Maskerourtney F, MD    Family History No family history on file.  Social History Social History   Tobacco Use  . Smoking status: Never Smoker  . Smokeless tobacco: Never Used  Substance Use Topics  . Alcohol use: Yes    Alcohol/week: 7.0 - 14.0 standard drinks    Types: 7 - 14 Standard drinks or equivalent per week  . Drug use: No     Allergies   Patient has no known allergies.  Review of Systems Review of Systems  Constitutional: Negative for fever.  Respiratory: Negative for shortness of breath.   Cardiovascular: Negative for chest pain.  Gastrointestinal: Positive for nausea. Negative for vomiting.  Genitourinary: Positive for flank pain. Negative for dysuria and hematuria.  Musculoskeletal: Negative for back pain.  Skin: Negative for rash.  Neurological: Negative for headaches.  All other systems reviewed and are negative.    Physical Exam Updated Vital Signs BP 137/89 (BP Location: Right Arm)   Pulse (!) 58   Temp (!) 97.4 F (36.3 C) (Oral)   Resp 16   Ht 1.829 m (6')   Wt 74.4 kg   SpO2 100%   BMI 22.24 kg/m   Physical Exam Vitals signs and nursing note reviewed.  Constitutional:      Appearance: Normal appearance. He is  well-developed.  HENT:     Head: Normocephalic and atraumatic.     Mouth/Throat:     Mouth: Mucous membranes are moist.  Cardiovascular:     Rate and Rhythm: Normal rate and regular rhythm.     Heart sounds: Normal heart sounds. No murmur.  Pulmonary:     Effort: Pulmonary effort is normal. No respiratory distress.     Breath sounds: Normal breath sounds. No wheezing.  Abdominal:     Palpations: Abdomen is soft.     Tenderness: There is no abdominal tenderness. There is right CVA tenderness. There is no guarding or rebound.  Musculoskeletal:     Right lower leg: No edema.     Left lower leg: No edema.  Skin:    General: Skin is warm and dry.  Neurological:     Mental Status: He is alert and oriented to person, place, and time.  Psychiatric:        Mood and Affect: Mood normal.      ED Treatments / Results  Labs (all labs ordered are listed, but only abnormal results are displayed) Labs Reviewed  URINALYSIS, ROUTINE W REFLEX MICROSCOPIC - Abnormal; Notable for the following components:      Result Value   Specific Gravity, Urine >1.030 (*)    Hgb urine dipstick LARGE (*)    All other components within normal limits  BASIC METABOLIC PANEL - Abnormal; Notable for the following components:   Potassium 3.4 (*)    Glucose, Bld 124 (*)    All other components within normal limits  URINALYSIS, MICROSCOPIC (REFLEX) - Abnormal; Notable for the following components:   Bacteria, UA FEW (*)    All other components within normal limits  CBC WITH DIFFERENTIAL/PLATELET    EKG None  Radiology Ct Renal Stone Study  Result Date: 08/19/2018 CLINICAL DATA:  Flank pain with stone disease suspected EXAM: CT ABDOMEN AND PELVIS WITHOUT CONTRAST TECHNIQUE: Multidetector CT imaging of the abdomen and pelvis was performed following the standard protocol without IV contrast. COMPARISON:  None. FINDINGS: Lower chest:  No contributory findings. Hepatobiliary: No focal liver abnormality.No  evidence of biliary obstruction or stone. Pancreas: Unremarkable. Spleen: Unremarkable. Adrenals/Urinary Tract: Negative adrenals. Right hydronephrosis and hydroureter due to a 2 mm stone at the right UVJ. Three or four punctate renal calculi on the left, see coronal reformats. Negative bladder Stomach/Bowel:  No obstruction. No appendicitis. Vascular/Lymphatic: No acute vascular abnormality. Retroaortic left renal vein. No mass or adenopathy. Reproductive:Nonspecific prosthetic calcification. Other: No ascites or pneumoperitoneum. Musculoskeletal: No acute abnormalities. IMPRESSION: 1. Right hydronephrosis from a 2 mm UVJ calculus. 2. Three or four punctate left renal calculi. Electronically Signed  By: Marnee Spring M.D.   On: 08/19/2018 06:45    Procedures Procedures (including critical care time)  Medications Ordered in ED Medications  sodium chloride 0.9 % bolus 1,000 mL (0 mLs Intravenous Stopped 08/19/18 0700)  ondansetron (ZOFRAN) injection 4 mg (4 mg Intravenous Given 08/19/18 0615)  ketorolac (TORADOL) 30 MG/ML injection 30 mg (30 mg Intravenous Given 08/19/18 0615)     Initial Impression / Assessment and Plan / ED Course  I have reviewed the triage vital signs and the nursing notes.  Pertinent labs & imaging results that were available during my care of the patient were reviewed by me and considered in my medical decision making (see chart for details).        Patient presents with acute onset of right flank pain.  He is otherwise nontoxic-appearing and vital signs are reassuring.  He is afebrile.  History is highly suggestive of kidney stone.  UTI is also consideration.  Less likely appendicitis.  Patient was given Toradol and fluids as well as Zofran.  He does have large hemoglobin in his urine.  CT scan shows a 2 mm UVJ stone with hydronephrosis.  I discussed the work-up with the patient.  He is now comfortable.  Will discharge with supportive measures and urology follow-up.   After history, exam, and medical workup I feel the patient has been appropriately medically screened and is safe for discharge home. Pertinent diagnoses were discussed with the patient. Patient was given return precautions.   Final Clinical Impressions(s) / ED Diagnoses   Final diagnoses:  Ureteral colic  Kidney stone    ED Discharge Orders         Ordered    ibuprofen (ADVIL) 600 MG tablet  Every 6 hours PRN     08/19/18 0655    tamsulosin (FLOMAX) 0.4 MG CAPS capsule  Daily     08/19/18 0655    HYDROcodone-acetaminophen (NORCO/VICODIN) 5-325 MG tablet  Every 6 hours PRN     08/19/18 0655    ondansetron (ZOFRAN ODT) 4 MG disintegrating tablet  Every 8 hours PRN     08/19/18 0655           Shon Baton, MD 08/19/18 [phone]

## 2018-08-19 NOTE — Discharge Instructions (Addendum)
You were seen today for flank pain.  Your CT scan shows a kidney stone.  Take medications as prescribed.  Do not drive while taking narcotic pain medications.  This stone will likely pass on its own.  Make sure that you stay hydrated.  If you develop fever, worsening flank pain, inability to tolerate fluids, you need to be reevaluated.

## 2018-08-19 NOTE — ED Notes (Signed)
Patient transported to CT 

## 2020-04-16 ENCOUNTER — Other Ambulatory Visit (HOSPITAL_COMMUNITY): Payer: Self-pay | Admitting: Internal Medicine

## 2020-04-16 DIAGNOSIS — G4700 Insomnia, unspecified: Secondary | ICD-10-CM | POA: Diagnosis not present

## 2020-04-16 MED FILL — clonazePAM 0.5 MG TABS: 0.5 | 30 days supply | Qty: 30 | Fill #0

## 2020-07-07 ENCOUNTER — Other Ambulatory Visit (HOSPITAL_COMMUNITY): Payer: Self-pay | Admitting: *Deleted

## 2020-07-12 ENCOUNTER — Ambulatory Visit (HOSPITAL_BASED_OUTPATIENT_CLINIC_OR_DEPARTMENT_OTHER)
Admission: RE | Admit: 2020-07-12 | Discharge: 2020-07-12 | Disposition: A | Discharge status: Home / Self Care | Payer: BC Managed Care – PPO | Source: Ambulatory Visit | Attending: Cardiology | Admitting: Cardiology

## 2020-07-12 ENCOUNTER — Other Ambulatory Visit: Payer: Self-pay

## 2020-09-15 DIAGNOSIS — F410 Panic disorder [episodic paroxysmal anxiety] without agoraphobia: Secondary | ICD-10-CM | POA: Diagnosis not present

## 2020-09-15 DIAGNOSIS — G4700 Insomnia, unspecified: Secondary | ICD-10-CM | POA: Diagnosis not present

## 2020-10-12 DIAGNOSIS — G4700 Insomnia, unspecified: Secondary | ICD-10-CM | POA: Diagnosis not present

## 2021-04-11 DIAGNOSIS — Z800 Family history of malignant neoplasm of digestive organs: Secondary | ICD-10-CM | POA: Diagnosis not present

## 2021-04-11 DIAGNOSIS — Z1211 Encounter for screening for malignant neoplasm of colon: Secondary | ICD-10-CM | POA: Diagnosis not present

## 2021-05-10 DIAGNOSIS — L905 Scar conditions and fibrosis of skin: Secondary | ICD-10-CM | POA: Diagnosis not present

## 2021-05-10 DIAGNOSIS — L814 Other melanin hyperpigmentation: Secondary | ICD-10-CM | POA: Diagnosis not present

## 2021-05-10 DIAGNOSIS — D1801 Hemangioma of skin and subcutaneous tissue: Secondary | ICD-10-CM | POA: Diagnosis not present

## 2021-05-10 DIAGNOSIS — D225 Melanocytic nevi of trunk: Secondary | ICD-10-CM | POA: Diagnosis not present

## 2021-09-06 ENCOUNTER — Other Ambulatory Visit: Payer: Self-pay | Admitting: Internal Medicine

## 2021-09-06 ENCOUNTER — Ambulatory Visit
Admission: RE | Admit: 2021-09-06 | Discharge: 2021-09-06 | Disposition: A | Discharge status: Home / Self Care | Payer: BC Managed Care – PPO | Source: Ambulatory Visit | Attending: Internal Medicine | Admitting: Internal Medicine

## 2021-09-06 DIAGNOSIS — M25532 Pain in left wrist: Secondary | ICD-10-CM | POA: Diagnosis not present

## 2021-09-22 DIAGNOSIS — G4700 Insomnia, unspecified: Secondary | ICD-10-CM | POA: Diagnosis not present

## 2021-12-06 DIAGNOSIS — U071 COVID-19: Secondary | ICD-10-CM | POA: Diagnosis not present

## 2021-12-06 DIAGNOSIS — R509 Fever, unspecified: Secondary | ICD-10-CM | POA: Diagnosis not present

## 2021-12-06 DIAGNOSIS — R059 Cough, unspecified: Secondary | ICD-10-CM | POA: Diagnosis not present

## 2021-12-16 ENCOUNTER — Ambulatory Visit (INDEPENDENT_AMBULATORY_CARE_PROVIDER_SITE_OTHER): Payer: BC Managed Care – PPO | Admitting: Neurology

## 2021-12-16 ENCOUNTER — Encounter: Payer: Self-pay | Admitting: Neurology

## 2021-12-16 VITALS — BP 124/75 | HR 58 | Ht 72.0 in | Wt 164.5 lb

## 2021-12-16 DIAGNOSIS — G43109 Migraine with aura, not intractable, without status migrainosus: Secondary | ICD-10-CM | POA: Diagnosis not present

## 2021-12-16 DIAGNOSIS — F04 Amnestic disorder due to known physiological condition: Secondary | ICD-10-CM

## 2021-12-16 DIAGNOSIS — E538 Deficiency of other specified B group vitamins: Secondary | ICD-10-CM

## 2021-12-16 DIAGNOSIS — R413 Other amnesia: Secondary | ICD-10-CM | POA: Diagnosis not present

## 2021-12-16 DIAGNOSIS — F09 Unspecified mental disorder due to known physiological condition: Secondary | ICD-10-CM | POA: Diagnosis not present

## 2021-12-16 DIAGNOSIS — H539 Unspecified visual disturbance: Secondary | ICD-10-CM

## 2021-12-16 DIAGNOSIS — E519 Thiamine deficiency, unspecified: Secondary | ICD-10-CM

## 2021-12-16 DIAGNOSIS — I679 Cerebrovascular disease, unspecified: Secondary | ICD-10-CM

## 2021-12-16 DIAGNOSIS — F5101 Primary insomnia: Secondary | ICD-10-CM

## 2021-12-16 NOTE — Progress Notes (Unsigned)
[facility]    Provider:  Dr Lucia Gaskins Requesting Provider: Marden Noble, MD Primary Care Provider:  Marden Noble, MD  CC:  memory loss  HPI:  Mark Fowler is a 56 y.o. male here as requested by Marden Noble, MD for memory concerns. PMHx migraines. For the last 10-20 years he cannot recall remote events to the same degree of people around him. He has lots of friends, he cannot recall a lot of their exploits. 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 His wife will remember an incident for a long weekend somewhere and he has no recollection even when his older father remembers and he has no recollection. Patient's oldest son went to the national robot[employer] conference and he doesn;t remember. No Fhx of dementia, father is 75 and sharp, mother died at 49 without memory problems, sister who is 12 and fine. He started noticing it 10-20 years after HS and doesn't think it started earlier, going back to HS reunions and not remembering very close friends is when he noticed. No hx of learning disabilities. No hx of drug use or significant marijuana use, did his share of drinking. He lost a son to suicide at the age of 25 and that brought on depression and some scotch at bedtime and CBT helped. Started drinking in highschool, 3-4 nights a week in college, on a weekend 4-6 beers every night on a weekend, after college he would drink after dinner and the last 20 years scotch just one after dinner and hasn't even drank in the last month. No signs or symptoms of sleep apnea: no fatigue, no naps, no morning headaches, no apneic events overnight, no naps. No hx of depression or anxiety. He is an avid exerciser, he has tinnitus, has had that 10-20 years. Migraines  started in college, they start with scintillating scotoma, vision changes, 20-30 minutes and then he feels bad but no head pain. Sporadic, hasn't had one for 6 months. He has been married 33 years, no psychiatric issues, no hx of learning disabilities as a child, son had adhd and central processing disorder, patient always did well in school. No short-term memory issues, no problems with executive functioning. No delusions, hallucinations. He has known someone since the age of 1 and for example forgot her name. Feels like he has decent memories until approx 10 years but hard to say. He has always had insomnia.  Reviewed notes, labs and imaging from outside physicians, which showed:  09/01/2007: Clinical Data: 56 year old male with new onset of episodic vertigo.     MRI HEAD WITHOUT AND WITH CONTRAST     Technique:  Multiplanar, multiecho pulse sequences of the brain and  surrounding structures were obtained according to standard protocol  without and with intravenous contrast     Contrast: 15 ml MultiHance.     Comparison: 12/17/2005.     Findings: No restricted diffusion, midline shift, ventriculomegaly,  mass effect, extra-axial collection or intracranial hemorrhage.  Normal cerebral volume.    Major vascular flow voids are preserved.  Vertebral arteries are balanced.  There is a left dominant  transverse sinus flow.  Normal cervicomedullary junction and  pituitary.  Normal marrow signal.  Visualized cervical spinal cord  within  normal limits.  No abnormal enhancement of the brain.  Unchanged solitary punctate focus of increased T2 and FLAIR signal  in the left centrum semiovale, of doubtful clinical significance  given this patient's age.  Otherwise, gray and white matter signal  is within normal limits throughout the brain.  Mild paranasal sinus  mucosal thickening involving the frontal, ethmoid and maxillary  sinuses which has increased since the prior exam.     Dedicated pre- and  postcontrast internal auditory canal imaging was  performed.  Cerebellopontine angles, bilateral seventh and eighth  cranial nerves are normal.  Other visualized cranial nerves are  also within normal limits.  Cavernous sinus is normal.  Bilateral  cochlear and vestibular apparatus are within normal limits.  No  cochlear or labyrinthine enhancement is identified on either side.  Tympanic and mastoids segments of the bilateral facial nerves are  normal.     IMPRESSION:     1. No acute intracranial abnormality.   Stable, unremarkable  appearance of the brain for age.  2.  Normal IAC imaging.   Review of Systems: Patient complains of symptoms per HPI as well as the following symptoms amnestic syndrome. Pertinent negatives and positives per HPI. All others negative.   Social History   Socioeconomic History   Marital status: Married    Spouse name: Not on file   Number of children: Not on file   Years of education: Not on file   Highest education level: Not on file  Occupational History   Not on file  Tobacco Use   Smoking status: Never   Smokeless tobacco: Never  Substance and Sexual Activity   Alcohol use: Not Currently   Drug use: No   Sexual activity: Not on file  Other Topics Concern   Not on file  Social History Narrative   Not on file   Social Determinants of Health   Financial Resource Strain: Not on file  Food Insecurity: Not on file  Transportation Needs: Not on file  Physical Activity: Not on file  Stress: Not on file  Social Connections: Not on file  Intimate Partner Violence: Not on file    Family History  Problem Relation Age of Onset   Alzheimer's disease Maternal Grandfather     Past Medical History:  Diagnosis Date   Left inguinal hernia 06/06/2012   Repaired 07/04/12    Medical history non-contributory     Patient Active Problem List   Diagnosis Date Noted   Memory loss 12/16/2021   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 (Patient taking differently: Take 600 mg by mouth as needed.) 30 tablet 0   VITAMIN D PO Take by mouth.     clonazePAM (KLONOPIN) 0.5 MG tablet TAKE 1 TABLET BY MOUTH ONCE A DAY AT BEDTIME 30 tablet 0   No current facility-administered medications for this visit.    Allergies as of 12/16/2021   (No Known Allergies)  Vitals: BP 124/75   Pulse (!) 58   Ht 6' (1.829 m)   Wt 164 lb 8 oz (74.6 kg)   BMI 22.31 kg/m  Last Weight:  Wt Readings from Last 1 Encounters:  12/16/21 164 lb 8 oz (74.6 kg)   Last Height:   Ht Readings from Last 1 Encounters:  12/16/21 6' (1.829 m)     Physical exam: Exam: Gen: NAD, conversant, well nourised, obese, well groomed                     CV: RRR, no MRG. No Carotid Bruits. No peripheral edema, warm, nontender Eyes: Conjunctivae clear without exudates or hemorrhage  Neuro: Detailed Neurologic Exam  Speech:    Speech is normal; fluent and spontaneous with normal comprehension.  Cognition:    The patient is oriented to person, place, and time;     recent and remote memory intact;     language fluent;     normal attention, concentration,     fund of knowledge Cranial Nerves:    The pupils are equal, round, and reactive to light. The fundi are normal and spontaneous venous pulsations are present. Visual fields are full to finger confrontation. Extraocular movements are intact. Trigeminal sensation is intact and the muscles of mastication are normal. The  face is symmetric. The palate elevates in the midline. Hearing intact. Voice is normal. Shoulder shrug is normal. The tongue has normal motion without fasciculations.   Coordination:    Normal finger to nose and heel to shin. Normal rapid alternating movements.   Gait:    Heel-toe and tandem gait are normal.   Motor Observation:    No asymmetry, no atrophy, and no involuntary movements noted. Tone:    Normal muscle tone.    Posture:    Posture is normal. normal erect    Strength:    Strength is V/V in the upper and lower limbs.      Sensation: intact to LT     Reflex Exam:  DTR's:    Deep tendon reflexes in the upper and lower extremities are normal bilaterally.   Toes:    The toes are downgoing bilaterally.   Clonus:    Clonus is absent.    Assessment/Plan:  Patient with long history of progressive amnestic syndrome, unclear diagnosis, needs thorough evaluation  Blood work today for reversible causes of : amnestic syndrome, long-term memory imparement, memory loss, amnesia  Long-term memory impairment - Plan: VITAMIN D PO, B12 and Folate Panel, Methylmalonic acid, serum, Vitamin B1, Homocysteine, Comprehensive metabolic panel, CBC with Differential/Platelets, TSH Rfx on Abnormal to Free T4, Ammonia, Sedimentation rate, Ambulatory referral to Neuropsychology, MR BRAIN W WO CONTRAST  Amnestic syndrome (HCC) - Plan: VITAMIN D PO, B12 and Folate Panel, Methylmalonic acid, serum, Vitamin B1, Homocysteine, Comprehensive metabolic panel, CBC with Differential/Platelets, TSH Rfx on Abnormal to Free T4, Ammonia, Sedimentation rate, Ambulatory referral to Neuropsychology, MR BRAIN W WO CONTRAST  Progressive cognitive dysfunction - Plan: VITAMIN D PO, B12 and Folate Panel, Methylmalonic acid, serum, Vitamin B1, Homocysteine, Comprehensive metabolic panel, CBC with Differential/Platelets, TSH Rfx on Abnormal to Free T4, Ammonia, Sedimentation rate, Ambulatory referral to Neuropsychology,  MR BRAIN W WO CONTRAST  Memory loss - Plan: B12 and Folate Panel, Methylmalonic acid, serum, Vitamin B1, Homocysteine, Comprehensive metabolic panel, CBC with Differential/Platelets, TSH Rfx on Abnormal to Free T4, Ammonia, Sedimentation rate, Ambulatory referral to Neuropsychology, MR BRAIN W WO CONTRAST  Amnesia - Plan: VITAMIN D PO,  B12 and Folate Panel, Methylmalonic acid, serum, Vitamin B1, Homocysteine, Comprehensive metabolic panel, CBC with Differential/Platelets, TSH Rfx on Abnormal to Free T4, Ammonia, Sedimentation rate, Ambulatory referral to Neuropsychology, MR BRAIN W WO CONTRAST  Ocular migraine - Plan: B12 and Folate Panel, Methylmalonic acid, serum, Vitamin B1, Homocysteine, Comprehensive metabolic panel, CBC with Differential/Platelets, TSH Rfx on Abnormal to Free T4, Ammonia, Sedimentation rate  screen for B12 deficiency - Plan: B12 and Folate Panel, Methylmalonic acid, serum, Homocysteine  screen for Vitamin B1 deficiency - Plan: Vitamin B1, Homocysteine  Screen for Cerebrovascular disease - Plan: Homocysteine, Sedimentation rate, MR BRAIN W WO CONTRAST  Vision changes - Plan: VITAMIN D PO, B12 and Folate Panel, Methylmalonic acid, serum, Vitamin B1, Homocysteine, Comprehensive metabolic panel, CBC with Differential/Platelets, TSH Rfx on Abnormal to Free T4, Ammonia, Sedimentation rate, Ambulatory referral to Neuropsychology, MR BRAIN W WO CONTRAST  Primary insomnia - Plan: B12 and Folate Panel, Methylmalonic acid, serum, Vitamin B1, Homocysteine, Comprehensive metabolic panel, CBC with Differential/Platelets, TSH Rfx on Abnormal to Free T4, Ammonia, Sedimentation rate    Orders Placed This Encounter  Procedures   MR BRAIN W WO CONTRAST   B12 and Folate Panel   Methylmalonic acid, serum   Vitamin B1   Homocysteine   Comprehensive metabolic panel   CBC with Differential/Platelets   TSH Rfx on Abnormal to Free T4   Ammonia   Sedimentation rate   Ambulatory referral  to Neuropsychology   Cc: Marden Noble, MD,  Marden Noble, MD  Naomie Dean, MD  [facility] [address] [place], [place] [address]  Phone [phone] Fax [phone]  I spent over 60 minutes of face-to-face and non-face-to-face time with patient on the  1. Long-term memory impairment   2. Amnestic syndrome (HCC)   3. Progressive cognitive dysfunction   4. Memory loss   5. Amnesia   6. Ocular migraine   7. screen for B12 deficiency   8. screen for Vitamin B1 deficiency   9. Screen for Cerebrovascular disease   10. Vision changes   11. Primary insomnia    diagnosis.  This included previsit chart review, lab review, study review, order entry, electronic health record documentation, patient education on the different diagnostic and therapeutic options, counseling and coordination of care, risks and benefits of management, compliance, or risk factor reduction

## 2021-12-16 NOTE — Patient Instructions (Addendum)
Blood work MRI of the brain w/wo contrast: Dr. Alfredo Batty to read (3T) Dr. Kieth Brightly Formal neurocognitive testing  Memory Compensation Strategies  Use "WARM" strategy.  W= write it down  A= associate it  R= repeat it  M= make a mental note  2.   You can keep a Glass blower/designer.  Use a 3-ring notebook with sections for the following: calendar, important names and phone numbers,  medications, doctors' names/phone numbers, lists/reminders, and a section to journal what you did  each day.   3.    Use a calendar to write appointments down.  4.    Write yourself a schedule for the day.  This can be placed on the calendar or in a separate section of the Memory Notebook.  Keeping a  regular schedule can help memory.  5.    Use medication organizer with sections for each day or morning/evening pills.  You may need help loading it  6.    Keep a basket, or pegboard by the door.  Place items that you need to take out with you in the basket or on the pegboard.  You may also want to  include a message board for reminders.  7.    Use sticky notes.  Place sticky notes with reminders in a place where the task is performed.  For example: " turn off the  stove" placed by the stove, "lock the door" placed on the door at eye level, " take your medications" on  the bathroom mirror or by the place where you normally take your medications.  8.    Use alarms/timers.  Use while cooking to remind yourself to check on food or as a reminder to take your medicine, or as a  reminder to make a call, or as a reminder to perform another task, etc.

## 2021-12-17 LAB — COMPREHENSIVE METABOLIC PANEL
ALT: 15 IU/L (ref 0–44)
AST: 17 IU/L (ref 0–40)
Alkaline Phosphatase: 59 IU/L (ref 44–121)
BUN: 15 mg/dL (ref 6–24)
Bilirubin Total: 0.4 mg/dL (ref 0.0–1.2)
CO2: 25 mmol/L (ref 20–29)
Calcium: 9.2 mg/dL (ref 8.7–10.2)
Chloride: 103 mmol/L (ref 96–106)
Globulin, Total: 2 g/dL (ref 1.5–4.5)
Glucose: 90 mg/dL (ref 70–99)
Potassium: 4.5 mmol/L (ref 3.5–5.2)
Total Protein: 6.4 g/dL (ref 6.0–8.5)
eGFR: 103 mL/min/{1.73_m2} (ref >59)

## 2021-12-17 LAB — B12 AND FOLATE PANEL
Folate: 6.4 ng/mL (ref >3.0)
Vitamin B-12: 389 pg/mL (ref 232–1245)

## 2021-12-17 LAB — CBC WITH DIFFERENTIAL/PLATELET
Hematocrit: 45 % (ref 37.5–51.0)
Lymphocytes Absolute: 1.4 10*3/uL (ref 0.7–3.1)
Monocytes Absolute: 0.4 10*3/uL (ref 0.1–0.9)
RBC: 4.88 x10E6/uL (ref 4.14–5.80)
RDW: 12.2 % (ref 11.6–15.4)

## 2021-12-17 LAB — VITAMIN B1

## 2021-12-17 LAB — AMMONIA: Ammonia: 42 ug/dL (ref 40–200)

## 2021-12-17 LAB — TSH RFX ON ABNORMAL TO FREE T4: TSH: 0.798 u[IU]/mL (ref 0.450–4.500)

## 2021-12-17 LAB — METHYLMALONIC ACID, SERUM

## 2021-12-19 ENCOUNTER — Encounter: Payer: Self-pay | Admitting: Neurology

## 2021-12-20 DIAGNOSIS — J0110 Acute frontal sinusitis, unspecified: Secondary | ICD-10-CM | POA: Diagnosis not present

## 2021-12-20 DIAGNOSIS — F419 Anxiety disorder, unspecified: Secondary | ICD-10-CM | POA: Diagnosis not present

## 2021-12-20 LAB — COMPREHENSIVE METABOLIC PANEL
Albumin/Globulin Ratio: 2.2 (ref 1.2–2.2)
Albumin: 4.4 g/dL (ref 3.8–4.9)
BUN/Creatinine Ratio: 19 (ref 9–20)
Creatinine, Ser: 0.78 mg/dL (ref 0.76–1.27)
Sodium: 140 mmol/L (ref 134–144)

## 2021-12-20 LAB — CBC WITH DIFFERENTIAL/PLATELET
Basophils Absolute: 0.1 10*3/uL (ref 0.0–0.2)
Basos: 1 %
EOS (ABSOLUTE): 0.2 10*3/uL (ref 0.0–0.4)
Eos: 3 %
Hemoglobin: 15.2 g/dL (ref 13.0–17.7)
Immature Grans (Abs): 0 10*3/uL (ref 0.0–0.1)
Immature Granulocytes: 0 %
Lymphs: 24 %
MCH: 31.1 pg (ref 26.6–33.0)
MCHC: 33.8 g/dL (ref 31.5–35.7)
MCV: 92 fL (ref 79–97)
Monocytes: 6 %
Neutrophils Absolute: 3.9 10*3/uL (ref 1.4–7.0)
Neutrophils: 66 %
Platelets: 304 10*3/uL (ref 150–450)
WBC: 5.9 10*3/uL (ref 3.4–10.8)

## 2021-12-20 LAB — HOMOCYSTEINE: Homocysteine: 14.6 umol/L — ABNORMAL HIGH (ref 0.0–14.5)

## 2021-12-20 LAB — SEDIMENTATION RATE: Sed Rate: 2 mm/hr (ref 0–30)

## 2021-12-21 ENCOUNTER — Telehealth: Payer: Self-pay | Admitting: Neurology

## 2021-12-21 NOTE — Telephone Encounter (Signed)
[employer] via [place] sent to GI for Dr. Jobe Igo to read

## 2021-12-26 ENCOUNTER — Telehealth: Payer: Self-pay | Admitting: Neurology

## 2021-12-26 NOTE — Telephone Encounter (Signed)
Pod 4 see phone note, please call him and discuss: Dr. Tery Sanfilippo, all labs are normal except your homocysteine is slighly elevated. This is an independent risk factor for dementia. Would you like to try a B vitamin supplement (foltx) and then recheck homocysteine and see if it improves? I'll have my my nurse call. Also we sent your MRI to [facility], please schedule it and make sure it is on Dr. Nori Riis list to read (and NOT on [facility]'s list so there is no confusion, I know you would like Dr. Jobe Igo to read - also let me know when you are scheduled and I can tell our tem hands off thanks) thank you !

## 2021-12-26 NOTE — Telephone Encounter (Signed)
I called pt.  LMVM  mobile [phone] for him that labs normal except slighty elevated Homocysteine (independent risk factor for dementia).  If wants to starts B vit supplement like FOLTX let us know.  Then would recheck lab to see if improves.  MRI scheduled 01-06-2022 for Dr Jobe Igo to read.

## 2022-01-03 ENCOUNTER — Ambulatory Visit: Payer: Self-pay | Admitting: Neurology

## 2022-01-04 ENCOUNTER — Encounter: Payer: Self-pay | Admitting: Psychology

## 2022-01-06 ENCOUNTER — Ambulatory Visit
Admission: RE | Admit: 2022-01-06 | Discharge: 2022-01-06 | Disposition: A | Discharge status: Home / Self Care | Payer: BC Managed Care – PPO | Source: Ambulatory Visit | Attending: Neurology | Admitting: Neurology

## 2022-01-06 ENCOUNTER — Other Ambulatory Visit: Payer: BC Managed Care – PPO

## 2022-01-06 DIAGNOSIS — F09 Unspecified mental disorder due to known physiological condition: Secondary | ICD-10-CM

## 2022-01-06 DIAGNOSIS — R413 Other amnesia: Secondary | ICD-10-CM

## 2022-01-06 DIAGNOSIS — H539 Unspecified visual disturbance: Secondary | ICD-10-CM

## 2022-01-06 DIAGNOSIS — F04 Amnestic disorder due to known physiological condition: Secondary | ICD-10-CM

## 2022-01-06 DIAGNOSIS — I679 Cerebrovascular disease, unspecified: Secondary | ICD-10-CM

## 2022-01-06 DIAGNOSIS — I672 Cerebral atherosclerosis: Secondary | ICD-10-CM | POA: Diagnosis not present

## 2022-01-06 MED ORDER — GADOPICLENOL 0.5 MMOL/ML IV SOLN
8.0000 mL | Freq: Once | INTRAVENOUS | Status: AC | PRN
  Administered 2022-01-06: 8 mL via INTRAVENOUS

## 2022-01-12 ENCOUNTER — Encounter: Payer: Self-pay | Admitting: Neurology

## 2022-01-31 MED ORDER — FOLIC ACID-VIT B6-VIT B12 2.5-25-1 MG PO TABS: 1.0000 | ORAL_TABLET | Freq: Every day | ORAL | 3 refills | Status: AC

## 2022-01-31 NOTE — Addendum Note (Signed)
Addended by: Brandon Melnick on: 01/31/2022 03:24 PM   Modules accepted: Orders

## 2022-01-31 NOTE — Telephone Encounter (Signed)
Foltx once daily generic is fine also I will order his stroke workup too as we discussed thanks

## 2022-01-31 NOTE — Telephone Encounter (Signed)
Pt returned call and is ok to start the Foltx. [facility] on [address].

## 2022-02-01 ENCOUNTER — Encounter: Payer: BC Managed Care – PPO | Attending: Psychology | Admitting: Psychology

## 2022-02-01 DIAGNOSIS — F5101 Primary insomnia: Secondary | ICD-10-CM | POA: Insufficient documentation

## 2022-02-01 DIAGNOSIS — R413 Other amnesia: Secondary | ICD-10-CM | POA: Diagnosis not present

## 2022-02-01 DIAGNOSIS — G43109 Migraine with aura, not intractable, without status migrainosus: Secondary | ICD-10-CM | POA: Diagnosis not present

## 2022-02-01 NOTE — Progress Notes (Signed)
Neuropsychological Consultation   Patient:   Mark Fowler   DOB:   10/24/1965  MR Number:  [id number]  Location:  [facility] FOR PAIN AND [facility] MEDICINE [facility] PHYSICAL MEDICINE AND REHABILITATION [address] [place], [address] [id number] [facility] [place] [address] Dept: [phone]           Date of Service:   02/01/2022  Start Time:   9 AM End Time:   11 AM  Today's visit was an in person visit that was conducted in my outpatient clinic office.  The patient myself were present for this clinical interview.  1 hour and 15 minutes was spent in face-to-face clinical interview and the other 45 minutes was spent with record review, report writing and setting up testing protocols.  Provider/Observer:  Arley Phenix, Psy.D.       Clinical Neuropsychologist       Billing Code/Service: 96116/[id number]  Chief Complaint:    Mark Fowler is a 56 year old male referred for neuropsychological evaluation by his treating psychiatrist Naomie Dean, MD as part of a larger neurological work-up requested by his PCP Marden Noble, MD.  The purpose for this evaluation was due to reports of concern around long-term memory loss and deficits.  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  Patient denies any other cognitive changes such as changes in visual spatial or visual constructional capacity, changes in executive functioning or decision-making, difficulties managing day-to-day life functions and work functions.  The patient reports that he has not noticed any specific changes or worsening in the symptoms in the past couple of years.  The patient does report that major life events in the past even going back to his childhood through high school years in college are not remembered at all when he interacts with lifelong friends and they discussed major  events that they experience together.  The patient reports that it is gotten to the point that it was even a joke between them about his lack of ability to remember people and events from his past.  The patient noted that he first became aware of this when he went to his 15-year high school reunion and the symptoms have persisted.  Reason for Service:  Mark Fowler is a 56 year old male referred for neuropsychological evaluation by his treating psychiatrist Naomie Dean, MD as part of a larger neurological work-up requested by his PCP Marden Noble, MD.  The purpose for this evaluation was due to reports of concern around long-term memory loss and deficits.  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  Patient denies any other cognitive changes such as changes in visual spatial or visual constructional capacity, changes in executive functioning or decision-making, difficulties managing day-to-day life functions and work functions.  The patient reports that he has not noticed any specific changes or worsening in the symptoms in the past couple of years.  The patient does report that major life events in the past even going back to his childhood through high school years in college are not remembered at all when he interacts with lifelong friends and they discussed major events that they experience together.  The patient reports that it is gotten to the point that it was even a joke between them about  his lack of ability to remember people and events from his past.  The patient noted that he first became aware of this when he went to his 15-year high school reunion and the symptoms have persisted.  The patient has a very limited past medical history other than a history of migraines.  The patient is in very good overall health other than history of ocular migraines and  episodes in the past related to significant primary insomnia.  The patient has participated in sleep related cognitive behavioral therapies that produced significant benefit with significant improvement in his sleep pattern.  The patient has no indication of any obstructive sleep apnea.  Patient denies any visual hallucinations/delusions, tremors or changes in gait or motor functions.  There is no family history of progressive degenerative process and his father is 72 years old and in good physical and cognitive health.  Patient's mother passed away at 78 without any memory issues and is 65 year old sister is doing quite well.  Developmentally, the patient was always an excellent student and there was no indication of any learning disabilities or difficulties.  The patient's son has been diagnosed with a central auditory processing disorder.  In the patient's descriptions of his current memory difficulties he reports that there is been little to no change of this over the past year or 2.  The patient reports that this first was noticed when he went to his 15-year high school reunion and interacted with people that he had a long history of interacting and knowing and could not remember them.  The patient has had instances where friends or family have described a major life events that the patient was not able to have any memory for.  However, the patient appears to have a very good memory for these events where he did not remember something and was able to describe in detail the setting and occurrence of these events.  The patient reports that all of the memories really revolve around life experiences and not factual types of information.  These memory difficulties are almost exclusively around remote events and people that have been in his life that he feels like he should have clear memories.  The patient has had an brain MRI in 2007/09 due to new onset of episodic vertigo and a repeat MRI in 2023.  2007 MRI  showed no significant acute process with only an unchanged solitary focus of increased T2 and FLAIR signal in the left centrum semiovale that was felt to be of doubtful clinical significance.  There was no other indication of small vessel disease or microvascular ischemic changes.  Patient had a repeat MRI performed on 01/06/2022 with comparisons to had MRIs performed in 2009 and 2007.  No acute process was noted and no significant white matter lesions were present.  No abnormalities noted in temporal lobes or hippocampal structures.  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  Otherwise it was a normal MRI.  The patient is having further work-ups for various potential's for this previous lacunar infarct and all potential causes by Dr. Lucia Gaskins but it is possible that this finding has been there for a long time.  Patient denies any history of concussive events or any acute changes in motor function or motor coordination/changes in gait.  Patient does have a history of acute difficulties with insomnia that started roughly in 2005 and is most significant  form lasting approximately 6 months.  This is likely related to significant sleep disturbance related to work responsibilities.  The patient acknowledges that historically he has suffered bouts of insomnia and recently went through a CBT-I program with good results for his sleep pattern and is worked on sleep hygiene issues.  Patient reports that appetite is good and that he has a very good diet and that he regularly exercises.  The patient reports that he typically has 1 glass of scotch in the evenings while reading and other variables to alcohol use are related to life events and social activities.  No other substance use is noted.  Current stressors include relationship issues/life stressors, demanding work schedule and still ongoing coping with the death of his  son from suicide in 07-12-15.  Behavioral Observation: PREVIN JIAN  presents as a 56 y.o.-year-old Right handed Caucasian Male who appeared his stated age. his dress was Appropriate and he was Well Groomed and his manners were Appropriate to the situation.  his participation was indicative of Appropriate and Attentive behaviors.  There were not physical disabilities noted.  he displayed an appropriate level of cooperation and motivation.     Interactions:    Active Appropriate  Attention:   within normal limits and attention span and concentration were age appropriate  Memory:   within normal limits; recent and remote memory intact  Visuo-spatial:  not examined  Speech (Volume):  normal  Speech:   normal; normal  Thought Process:  Coherent and Relevant  Though Content:  WNL; not suicidal and not homicidal  Orientation:   person, place, time/date, and situation  Judgment:   Good  Planning:   Good  Affect:    Appropriate  Mood:    Euthymic  Insight:   Good  Intelligence:   very high  Marital Status/Living: The patient was born and raised in [place] along with 1 sibling.  There were no complications during his mother's pregnancy or delivery.  Developmental milestones were reached at the appropriate time.  The patient lives with his wife of 15 years and has no previous marriages.  The patient has a 77 year old son doing quite well.  The patient had another son who is now deceased passing away in 07/12/2015 who dealt with multiple psychiatric issues potentially mood disorder and very poor poor coping skills.  The patient's saw multiple psychiatric/psychological practitioners and was hospitalized for suicidal ideation prior to his suicide at age 68.  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  Hobbies and interest include swimming, cycling, reading and fishing.  Substance Use:  No concerns of substance  abuse are reported.  The patient does have 1 glass of alcohol per night on a regular basis and we did address the interaction between alcohol, sleep and memory.  Education:   The patient always did very well in school and completed both his PhD and MD graduating cum laude in college and in the top 10% of his medical school.  Patient went to [employer] for undergraduate school in the [place] for his medical degree.  The patient always did very well and life sciences reports that he had some relative difficulties with math but was always an excellent student.  Patient participated in endurance sports in school and continues to cycle and engage in open water swimming for continued exercise.  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  Psychiatric History:  No significant prior psychiatric history and denies any current depressive or anxiety type symptoms.  Patient has had episodes of significant insomnia primarily related to work related challenges.  Family Med/Psych History:  Family History  Problem Relation Age of Onset   Alzheimer's disease Maternal Grandfather     Impression/DX:  MARICE ANGELINO is a 56 year old male referred for neuropsychological evaluation by his treating psychiatrist Naomie Dean, MD as part of a larger neurological work-up requested by his PCP Marden Noble, MD.  The purpose for this evaluation was due to reports of concern around long-term memory loss and deficits.  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  Patient denies any other cognitive changes such as changes in visual spatial or visual constructional capacity, changes in executive functioning or decision-making, difficulties managing day-to-day life functions and work functions.  The patient reports that he has not noticed any specific changes or worsening in the symptoms in the past couple of years.  The patient does report that major life events in the past even going back to his childhood through high school years in college are not remembered at all when he interacts with lifelong friends and they discussed major events that they experience together.  The patient reports that it is gotten to the point that it was even a joke between them about his lack of ability to remember people and events from his past.  The patient noted that he first became aware of this when he went to his 15-year high school reunion and the symptoms have persisted.  Disposition/Plan:  We have set the patient up for formal neuropsychological assessment will complete a foundational battery of the Wechsler Adult Intelligence Scale's and the Wechsler Memory Scale's.  Once these are completed a determination will be made if any other further assessment is needed or warranted.  Not only will we look at the results of this testing from a diagnostic perspective but I will also provide a good baseline if needed in the future.  Once this is completed the information derived will be made available to his referring physicians and available in his MRI.  I will also sit down with the patient and go over these results and any recommendations not only directly derived from the formal testing but also from clinically relevant information.  Diagnosis:    Memory loss  Ocular migraine  Primary insomnia         Electronically Signed   _______________________ Arley Phenix,  Psy.D. Clinical Neuropsychologist

## 2022-02-08 ENCOUNTER — Other Ambulatory Visit: Payer: Self-pay | Admitting: Neurology

## 2022-02-08 ENCOUNTER — Telehealth: Payer: Self-pay | Admitting: Neurology

## 2022-02-08 DIAGNOSIS — I6310 Cerebral infarction due to embolism of unspecified precerebral artery: Secondary | ICD-10-CM

## 2022-02-08 DIAGNOSIS — I4891 Unspecified atrial fibrillation: Secondary | ICD-10-CM

## 2022-02-08 DIAGNOSIS — I639 Cerebral infarction, unspecified: Secondary | ICD-10-CM

## 2022-02-08 NOTE — Telephone Encounter (Signed)
Scheduling team at [facility] would like some clarification on when you want the echo and transcranial doppler scheduled for this patient? Right now the echo order says February. Thanks!

## 2022-02-10 ENCOUNTER — Other Ambulatory Visit: Payer: Self-pay | Admitting: Neurology

## 2022-02-10 DIAGNOSIS — I6310 Cerebral infarction due to embolism of unspecified precerebral artery: Secondary | ICD-10-CM | POA: Insufficient documentation

## 2022-02-10 DIAGNOSIS — D6859 Other primary thrombophilia: Secondary | ICD-10-CM

## 2022-02-14 DIAGNOSIS — G43109 Migraine with aura, not intractable, without status migrainosus: Secondary | ICD-10-CM | POA: Diagnosis not present

## 2022-02-14 DIAGNOSIS — R413 Other amnesia: Secondary | ICD-10-CM

## 2022-02-14 DIAGNOSIS — F5101 Primary insomnia: Secondary | ICD-10-CM | POA: Diagnosis not present

## 2022-02-14 NOTE — Progress Notes (Signed)
Behavioral Observations  The patient appeared well-groomed and appropriately dressed. His manners were polite and appropriate to the situation. The patient's attitude toward testing was positive and he showed good effort.     Neuropsychology Note  Danise Mina completed 210 minutes of neuropsychological testing with technician, Marica Otter, BA, under the supervision of Arley Phenix, PsyD., Clinical Neuropsychologist. The patient did not appear overtly distressed by the testing session, per behavioral observation or via self-report to the technician. Rest breaks were offered.   Clinical Decision Making: In considering the patient's current level of functioning, level of presumed impairment, nature of symptoms, emotional and behavioral responses during clinical interview, level of literacy, and observed level of motivation/effort, a battery of tests was selected by Dr. Kieth Brightly during initial consultation on 02/01/2022. This was communicated to the technician. Communication between the neuropsychologist and technician was ongoing throughout the testing session and changes were made as deemed necessary based on patient performance on testing, technician observations and additional pertinent factors such as those listed above.  Tests Administered: Wechsler Adult Intelligence Scale, 4th Edition (WAIS-IV) Wechsler Memory Scale, 4th Edition (WMS-IV); Adult Battery   Results:  WAIS-IV  Composite Score Summary  Scale Sum of Scaled Scores Composite Score Percentile Rank 95% Conf. Interval Qualitative Description  Verbal Comprehension 46 VCI 132 98 125-136 Very Superior  Perceptual Reasoning 43 PRI 125 95 118-130 Superior  Working Memory 24 WMI 111 77 104-117 High Average  Processing Speed 27 PSI 120 91 110-126 Superior  Full Scale 140 FSIQ 129 97 124-132 Superior  General Ability 89 GAI 132 98 126-136 Very Superior      Verbal Comprehension Subtests Summary  Subtest Raw Score  Scaled Score Percentile Rank Reference Group Scaled Score SEM  Similarities 34 16 98 16 1.08  Vocabulary 56 18 99.6 19 0.73  Information 19 12 75 14 0.67  (Comprehension) 30 13 84 13 1.08       Perceptual Reasoning Subtests Summary  Subtest Raw Score Scaled Score Percentile Rank Reference Group Scaled Score SEM  Block Design 44 12 75 10 1.04  Matrix Reasoning 23 16 98 14 0.95  Visual Puzzles 19 15 95 12 0.99  (Figure Weights) 15 12 75 10 0.99  (Picture Completion) 15 12 75 11 1.12       Working Librarian, academic Raw Score Scaled Score Percentile Rank Reference Group Scaled Score SEM  Digit Span 29 11 63 10 0.85  Arithmetic 18 13 84 13 1.04  (Letter-Number Seq.) 24 16 98 14 1.08       Processing Speed Subtests Summary  Subtest Raw Score Scaled Score Percentile Rank Reference Group Scaled Score SEM  Symbol Search 39 14 91 12 1.31  Coding 74 13 84 10 0.99  (Cancellation) 45 13 84 11 1.34       WMS-IV  Index Score Summary  Index Sum of Scaled Scores Index Score Percentile Rank 95% Confidence Interval Qualitative Descriptor  Auditory Memory (AMI) 56 124 95 117-129 Superior  Visual Memory (VMI) 55 125 95 118-130 Superior  Visual Working Memory (VWMI) 26 117 87 109-123 High Average  Immediate Memory (IMI) 54 125 95 118-130 Superior  Delayed Memory (DMI) 57 130 98 121-135 Very Superior      Primary Subtest Scaled Score Summary  Subtest Domain Raw Score Scaled Score Percentile Rank  Logical Memory I AM 39 15 95  Logical Memory II AM 35 15 95  Verbal Paired Associates I AM 42 13 84  Verbal Paired Associates II AM 12 13 84  Designs I VM 73 12 75  Designs II VM 61 12 75  Visual Reproduction I VM 41 14 91  Visual Reproduction II VM 41 17 99  Spatial Addition VWM 15 13 84  Symbol Span VWM 27 13 84       Auditory Memory Process Score Summary  Process Score Raw Score Scaled Score Percentile Rank Cumulative Percentage (Base Rate)  LM II  Recognition 26 - - >75%  VPA II Recognition 39 - - 51-75%       Visual Memory Process Score Summary  Process Score Raw Score Scaled Score Percentile Rank Cumulative Percentage (Base Rate)  DE I Content 35 10 50 -  DE I Spatial 18 11 63 -  DE II Content 38 13 84 -  DE II Spatial 11 10 50 -  DE II Recognition 14 - - 26-50%  VR II Recognition 7 - - >75%      ABILITY-MEMORY ANALYSIS  Ability Score:  GAI: 132 Date of Testing:  WAIS-IV; WMS-IV 2022/02/14  Predicted Difference Method   Index Predicted WMS-IV Index Score Actual WMS-IV Index Score Difference Critical Value  Significant Difference Y/N Base Rate  Auditory Memory 117 124 -7 8.95 N   Visual Memory 119 125 -6 8.82 N   Visual Working Memory 121 117 4 11.24 N   Immediate Memory 121 125 -4 10.35 N   Delayed Memory 119 130 -11 10.08 Y 20%  Statistical significance (critical value) at the .01 level.       Feedback to Patient: GRADEN HOSHINO will return on 08/14/2022 or sooner for an interactive feedback session with Dr. Kieth Brightly at which time his test performances, clinical impressions and treatment recommendations will be reviewed in detail. The patient understands he can contact our office should he require our assistance before this time.  210 minutes spent face-to-face with patient administering standardized tests, 30 minutes spent scoring Radiographer, therapeutic). [CPT [id number], [id number]]  Full report to follow.

## 2022-02-15 ENCOUNTER — Ambulatory Visit
Admission: RE | Admit: 2022-02-15 | Discharge: 2022-02-15 | Disposition: A | Discharge status: Home / Self Care | Payer: BC Managed Care – PPO | Source: Ambulatory Visit | Attending: Neurology | Admitting: Neurology

## 2022-02-15 DIAGNOSIS — I6310 Cerebral infarction due to embolism of unspecified precerebral artery: Secondary | ICD-10-CM

## 2022-02-15 DIAGNOSIS — R413 Other amnesia: Secondary | ICD-10-CM | POA: Diagnosis not present

## 2022-02-15 DIAGNOSIS — I672 Cerebral atherosclerosis: Secondary | ICD-10-CM | POA: Diagnosis not present

## 2022-02-15 DIAGNOSIS — Z8673 Personal history of transient ischemic attack (TIA), and cerebral infarction without residual deficits: Secondary | ICD-10-CM | POA: Diagnosis not present

## 2022-02-15 MED ORDER — IOPAMIDOL (ISOVUE-370) INJECTION 76%
75.0000 mL | Freq: Once | INTRAVENOUS | Status: AC | PRN
  Administered 2022-02-15: 75 mL via INTRAVENOUS

## 2022-02-16 ENCOUNTER — Other Ambulatory Visit (INDEPENDENT_AMBULATORY_CARE_PROVIDER_SITE_OTHER): Payer: Self-pay

## 2022-02-16 DIAGNOSIS — D6859 Other primary thrombophilia: Secondary | ICD-10-CM

## 2022-02-16 DIAGNOSIS — I6310 Cerebral infarction due to embolism of unspecified precerebral artery: Secondary | ICD-10-CM

## 2022-02-16 DIAGNOSIS — Z0289 Encounter for other administrative examinations: Secondary | ICD-10-CM

## 2022-02-17 ENCOUNTER — Ambulatory Visit: Payer: BC Managed Care – PPO | Attending: Neurology

## 2022-02-17 DIAGNOSIS — I6310 Cerebral infarction due to embolism of unspecified precerebral artery: Secondary | ICD-10-CM

## 2022-02-17 DIAGNOSIS — I4891 Unspecified atrial fibrillation: Secondary | ICD-10-CM

## 2022-02-17 DIAGNOSIS — I639 Cerebral infarction, unspecified: Secondary | ICD-10-CM | POA: Diagnosis not present

## 2022-02-19 ENCOUNTER — Encounter: Payer: Self-pay | Admitting: Neurology

## 2022-02-20 ENCOUNTER — Other Ambulatory Visit: Payer: Self-pay | Admitting: Neurology

## 2022-02-20 DIAGNOSIS — I63449 Cerebral infarction due to embolism of unspecified cerebellar artery: Secondary | ICD-10-CM

## 2022-02-22 ENCOUNTER — Other Ambulatory Visit (HOSPITAL_COMMUNITY): Payer: BC Managed Care – PPO

## 2022-02-24 LAB — LIPID PANEL
Chol/HDL Ratio: 2.3 ratio (ref 0.0–5.0)
Cholesterol, Total: 149 mg/dL (ref 100–199)
HDL: 66 mg/dL (ref >39)
LDL Chol Calc (NIH): 74 mg/dL (ref 0–99)
Triglycerides: 38 mg/dL (ref 0–149)
VLDL Cholesterol Cal: 9 mg/dL (ref 5–40)

## 2022-02-24 LAB — PROTEIN C ACTIVITY: Protein C Activity: 124 % (ref 73–180)

## 2022-02-24 LAB — LUPUS ANTICOAGULANT
Dilute Viper Venom Time: 32.1 s (ref 0.0–47.0)
PTT Lupus Anticoagulant: 34.2 s (ref 0.0–43.5)
Thrombin Time: 18.1 s (ref 0.0–23.0)
dPT Confirm Ratio: 1.03 Ratio (ref 0.00–1.34)
dPT: 33.4 s (ref 0.0–47.6)

## 2022-02-24 LAB — ANA COMPREHENSIVE PANEL
Anti JO-1: <0.2 AI (ref 0.0–0.9)
Centromere Ab Screen: <0.2 AI (ref 0.0–0.9)
Chromatin Ab SerPl-aCnc: <0.2 AI (ref 0.0–0.9)
ENA RNP Ab: 0.9 AI (ref 0.0–0.9)
ENA SM Ab Ser-aCnc: <0.2 AI (ref 0.0–0.9)
ENA SSA (RO) Ab: <0.2 AI (ref 0.0–0.9)
ENA SSB (LA) Ab: <0.2 AI (ref 0.0–0.9)
Scleroderma (Scl-70) (ENA) Antibody, IgG: <0.2 AI (ref 0.0–0.9)
dsDNA Ab: <1 IU/mL (ref 0–9)

## 2022-02-24 LAB — BETA-2-GLYCOPROTEIN I ABS, IGG/M/A
Beta-2 Glyco 1 IgA: <9 GPI IgA units (ref 0–25)
Beta-2 Glyco 1 IgM: <9 GPI IgM units (ref 0–32)
Beta-2 Glyco I IgG: <9 GPI IgG units (ref 0–20)

## 2022-02-24 LAB — PROTEIN C, TOTAL: Protein C Antigen: 87 % (ref 60–150)

## 2022-02-24 LAB — PROTHROMBIN GENE MUTATION

## 2022-02-24 LAB — CARDIOLIPIN ANTIBODIES, IGG, IGM, IGA
Anticardiolipin IgA: <9 APL U/mL (ref 0–11)
Anticardiolipin IgG: <9 GPL U/mL (ref 0–14)
Anticardiolipin IgM: <9 MPL U/mL (ref 0–12)

## 2022-02-24 LAB — FACTOR 5 LEIDEN

## 2022-02-24 LAB — PROTEIN S ACTIVITY: Protein S Activity: 74 % (ref 63–140)

## 2022-02-24 LAB — ANTITHROMBIN III: AntiThromb III Func: 103 % (ref 75–135)

## 2022-02-24 LAB — HEMOGLOBIN A1C
Est. average glucose Bld gHb Est-mCnc: 105 mg/dL
Hgb A1c MFr Bld: 5.3 % (ref 4.8–5.6)

## 2022-02-24 LAB — PROTEIN S, TOTAL: Protein S Ag, Total: 65 % (ref 60–150)

## 2022-02-24 LAB — HOMOCYSTEINE: Homocysteine: 11.1 umol/L (ref 0.0–14.5)

## 2022-02-27 ENCOUNTER — Telehealth: Payer: Self-pay | Admitting: *Deleted

## 2022-02-27 ENCOUNTER — Ambulatory Visit (HOSPITAL_COMMUNITY)
Admission: RE | Admit: 2022-02-27 | Discharge: 2022-02-27 | Disposition: A | Discharge status: Home / Self Care | Payer: BC Managed Care – PPO | Source: Ambulatory Visit | Attending: Neurology | Admitting: Neurology

## 2022-02-27 DIAGNOSIS — I6310 Cerebral infarction due to embolism of unspecified precerebral artery: Secondary | ICD-10-CM | POA: Diagnosis not present

## 2022-02-27 DIAGNOSIS — I340 Nonrheumatic mitral (valve) insufficiency: Secondary | ICD-10-CM | POA: Insufficient documentation

## 2022-02-27 DIAGNOSIS — I341 Nonrheumatic mitral (valve) prolapse: Secondary | ICD-10-CM | POA: Diagnosis not present

## 2022-02-27 DIAGNOSIS — I6389 Other cerebral infarction: Secondary | ICD-10-CM

## 2022-02-27 LAB — ECHOCARDIOGRAM COMPLETE BUBBLE STUDY
Area-P 1/2: 2.36 cm2
MV VTI: 2.93 cm2
S' Lateral: 3.4 cm

## 2022-02-27 NOTE — Telephone Encounter (Signed)
Let dr Molli Posey know thanks

## 2022-02-27 NOTE — Telephone Encounter (Signed)
Discussed with Dr. Eden Emms and he does not feel he need an appointment at this time.

## 2022-02-27 NOTE — Telephone Encounter (Signed)
I may refer him to cardiology after this, I am not sure of the significance of multifocal PVCs. Or I will at least ask kelly on this mychart message if he needs to be seen by cardiology for this. Mark Fowler does he need to be evaluated by cardiology for this?

## 2022-02-27 NOTE — Telephone Encounter (Signed)
   Cardiac Monitor Alert  Date of alert:  02/27/2022   Patient Name: Mark Fowler  DOB: 1966-02-10  MRN: [id number]   [facility] Cardiologist: None  [place] [facility] EP:  None    Monitor Information: Cardiac Event Monitor [[employer]]  Reason:  Cryptogenic Stroke Ordering provider:  Dr. Naomie Dean   :1}  Alert Multifocal PVC's  --No afib This is the 1st alert for this rhythm.    The patient was contacted today.  Left voicemail for patient to call back. Arrhythmia, symptoms and history reviewed with Dr. Eden Emms .   Plan:  Continue to wear monitor, no changes made     Dennis Bast, RN  02/27/2022 10:42 AM

## 2022-02-27 NOTE — Progress Notes (Signed)
  Echocardiogram 2D Echocardiogram has been performed.  Mark Fowler 02/27/2022, 11:12 AM

## 2022-02-27 NOTE — Telephone Encounter (Signed)
Pt is returning call. Transferred to [employer], Charity fundraiser.

## 2022-02-27 NOTE — Telephone Encounter (Signed)
Patient returned call and states he did not have any symptoms at the time of event sent over.  He did mention at that time he was in the middle of a stressful case.  I let him know I would forward the results over to Dr. Lucia Gaskins.

## 2022-03-02 ENCOUNTER — Telehealth: Payer: Self-pay

## 2022-03-02 DIAGNOSIS — I4729 Other ventricular tachycardia: Secondary | ICD-10-CM

## 2022-03-02 NOTE — Telephone Encounter (Deleted)
Patient's wife already knows a referral was going to be sent to Dr Elberta Fortis.

## 2022-03-02 NOTE — Telephone Encounter (Signed)
Mychart message sent to patient.

## 2022-03-02 NOTE — Telephone Encounter (Signed)
Thank you, please call patient and make sure he is aware of the referral and why. If you can't reach him please let me know thanks thanks

## 2022-03-02 NOTE — Telephone Encounter (Signed)
   Cardiac Monitor Alert  Date of alert:  03/02/2022   Patient Name: Mark Fowler  DOB: [birth year]  MRN: [id number]   [facility] Cardiologist: None  [place] EP:  None    Monitor Information: Cardiac Event Monitor [[employer]]  Reason: CVA Ordering provider:  Dr. Naomie Dean (Neurology)   Alert Sinus Bradycardia w/run of V-Tach (8 BEATS)/PACs  on 03/02/22 at 2:26 am CST This is the 1st alert for this rhythm.   Next Cardiology Appointment   Date: none Provider: none    The patient could NOT be reached by telephone today.  Left message for patient to call back. Patient called back and stated he was sleeping at the time. Arrhythmia, symptoms and history reviewed with Dr. Ladona Ridgel, DOD. He stated that it looks like NSVT and for patient to follow-up with EP, Dr. Elberta Fortis.   Plan:  Will send message to Dr. Lucia Gaskins to get an order for referral. Will also send message to EP scheduler.   Ethelda Chick, RN  03/02/2022 9:20 AM

## 2022-03-03 ENCOUNTER — Encounter: Payer: Self-pay | Admitting: Neurology

## 2022-03-03 ENCOUNTER — Ambulatory Visit (HOSPITAL_COMMUNITY)
Admission: RE | Admit: 2022-03-03 | Discharge: 2022-03-03 | Disposition: A | Discharge status: Home / Self Care | Payer: BC Managed Care – PPO | Source: Ambulatory Visit | Attending: Neurology | Admitting: Neurology

## 2022-03-03 DIAGNOSIS — I63449 Cerebral infarction due to embolism of unspecified cerebellar artery: Secondary | ICD-10-CM

## 2022-03-05 ENCOUNTER — Telehealth: Payer: Self-pay | Admitting: Internal Medicine

## 2022-03-05 NOTE — Telephone Encounter (Signed)
I was notified by the patient's heart monitor company that the patient had multiple episodes of symptomatic atrial fibrillation with rapid ventricular rate. I spoke the patient and he said he had been doing fine. 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

## 2022-03-06 ENCOUNTER — Telehealth: Payer: Self-pay

## 2022-03-06 MED ORDER — APIXABAN 5 MG PO TABS: 5.0000 mg | ORAL_TABLET | Freq: Two times a day (BID) | ORAL | 6 refills | Status: DC

## 2022-03-06 NOTE — Telephone Encounter (Signed)
Thank you !!!!   I spoke to patient. I spoke to patient today. We discussed. He has history of stroke. ChadsVasc2 2 I recommend eliquis. He has follow up with cardiology.

## 2022-03-06 NOTE — Telephone Encounter (Signed)
Thank you ! I did speak with him and recommended eliquis and he has a follow up with cardiology outpatient next month. Thank you !

## 2022-03-06 NOTE — Telephone Encounter (Signed)
   Cardiac Monitor Alert  Date of alert:  03/06/2022   Patient Name: Mark Fowler  DOB: October 20, 1965  MRN: [id number]   [facility] Cardiologist: None  [facility] EP:  None    Monitor Information: Cardiac Event Monitor [[employer]]  Reason:  CVA Ordering provider:  Dr. Naomie Dean (Neurology)   Alert Atrial Fibrillation/Flutter This is the 1st alert for this rhythm.  The patient has no hx of Atrial Fibrillation/Flutter.  The patient is not currently on anticoagulation.  Next Cardiology Appointment   Patient has been referred, but no appointment has been scheduled at this time.  The patient was contacted by Dr. Rosita Fire, the on call provider, on 03/05/22 at 8:01 pm.  He was asymptomatic. See phone note 03/05/22. Patient has been referred to EP on 03/02/22, no appointment schedule at this time.  Consulted DOD, Dr. Anne Fu, he recommends letting Dr. Lucia Gaskins know that monitor shows "Atrial Fibrillation with RVR Non Sustained" and he is not on any anticoagulation medication.    Other: Will forward to Dr. Luster Landsberg, RN  03/06/2022 1:21 PM

## 2022-03-07 ENCOUNTER — Encounter: Payer: BC Managed Care – PPO | Attending: Psychology | Admitting: Psychology

## 2022-03-07 ENCOUNTER — Encounter: Payer: Self-pay | Admitting: Psychology

## 2022-03-07 DIAGNOSIS — R413 Other amnesia: Secondary | ICD-10-CM | POA: Diagnosis not present

## 2022-03-07 DIAGNOSIS — I6310 Cerebral infarction due to embolism of unspecified precerebral artery: Secondary | ICD-10-CM | POA: Diagnosis not present

## 2022-03-07 DIAGNOSIS — G43109 Migraine with aura, not intractable, without status migrainosus: Secondary | ICD-10-CM | POA: Diagnosis not present

## 2022-03-07 DIAGNOSIS — F5101 Primary insomnia: Secondary | ICD-10-CM | POA: Insufficient documentation

## 2022-03-07 NOTE — Progress Notes (Signed)
Neuropsychological Evaluation   Patient:  Mark Fowler   DOB: 07/24/1965  MR Number: [id number]  Location: [facility] [place], [place] [id number] [place] [address] Dept: [phone]  Start: 8 AM End: 9 AM  Provider/Observer:     Edgardo Roys PsyD  Chief Complaint:      Chief Complaint  Patient presents with   Memory Loss   Headache   Sleeping Problem    Reason For Service:     CHERIE KHERA is a 56 year old male referred for neuropsychological evaluation by his treating psychiatrist Sarina Ill, MD as part of a larger neurological work-up requested by his PCP Josetta Huddle, MD.  The purpose for this evaluation was due to reports of concern around long-term memory loss and deficits.  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  Patient denies any other cognitive changes such as changes in visual spatial or visual constructional capacity, changes in executive functioning or decision-making, difficulties managing day-to-day life functions and work functions.  The patient reports that he has not noticed any specific changes or worsening in the symptoms in the past couple of years.  The patient does report that major life events in the past even going back to his childhood through high school years in college are not remembered at all when he interacts with lifelong friends and they discussed major events that they experience together.  The patient reports that it has gotten to the point that it was even a joke about his lack of ability to remember people and events from his past.  The patient noted that he first became aware of this when he went to his 15-year high school reunion and the symptoms have persisted.   The patient has a  very limited past medical history other than a history of migraines.  The patient is in very good overall health other than history of ocular migraines and episodes in the past related to significant primary insomnia.  The patient has participated in sleep related cognitive behavioral therapies that produced significant benefit with significant improvement in his sleep pattern.  The patient has no indication of any obstructive sleep apnea.  Patient denies any visual hallucinations/delusions, tremors or changes in gait or motor functions.  There is no family history of progressive degenerative process and his father is 13 years old and in good physical and cognitive health.  Patient's mother passed away at 29 without any memory issues and his 22 year old sister is doing quite well.  Developmentally, the patient was always an excellent student and there was no indication of any learning disabilities or difficulties.  The patient's son has been diagnosed with a central auditory processing disorder.   In the patient's descriptions of his current memory difficulties, he reports that there has been little to no change of this over the past year or 2.  The patient reports that this first was noticed when he went to his 15-year high school reunion and interacted with people that he had a long history of interacting and knowing and could not remember them.  The patient has had instances where friends or family have described a major life events that the patient was not able to have any memory for.  However, the patient appears to have a very good memory for  for the times where he did not remember something and was able to describe in detail the setting and occurrence of these events.  The patient reports that all of the memories really revolve around life experiences and not factual types of information.  These memory difficulties are almost exclusively around remote events and people that have been in his life that he  feels like he should have clear memories.   The patient has had an brain MRI in 2007/09 due to new onset of episodic vertigo and a repeat MRI in 2023.  2007 MRI showed no significant acute process with only an unchanged solitary focus of increased T2 and FLAIR signal in the left centrum semiovale that was felt to be of doubtful clinical significance.  There was no other indication of small vessel disease or microvascular ischemic changes.  Patient had a repeat MRI performed on 01/06/2022 with comparisons to had MRIs performed in 2009 and 2007.  No acute process was noted and no significant white matter lesions were present.  No abnormalities noted in temporal lobes or hippocampal structures.  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  Otherwise it was a normal MRI.  The patient is having further work-ups for various potential's for this previous lacunar infarct and all potential causes by Dr. Lucia Gaskins but it is possible that this finding has been there for a long time.  Patient denies any history of concussive events or any acute changes in motor function or motor coordination/changes in gait.   Patient does have a history of acute difficulties with insomnia that started roughly in 2005 and in its most significant form lasting approximately 6 months.  This is likely related to significant sleep disturbance related to work responsibilities.  The patient acknowledges that historically he has suffered bouts of insomnia and recently went through a CBT-I program with good results for his sleep pattern and has worked on sleep hygiene issues.  Patient reports that appetite is good and that he has a very good diet and that he regularly exercises.  The patient reports that he typically has 1 glass of scotch in the evenings while reading and other variables for limited alcohol use are related to life events and social  activities.  No other substance use is noted.  Current stressors include relationship issues/life stressors, demanding work schedule and still ongoing coping with the death of his son from suicide in 2017.  Tests Administered: Wechsler Adult Intelligence Scale, 4th Edition (WAIS-IV) Wechsler Memory Scale, 4th Edition (WMS-IV); Adult Battery  Participation Level:   Active  Participation Quality:  Appropriate      Behavioral Observation:  The patient appeared well-groomed and appropriately dressed. His manners were polite and appropriate to the situation. The patient's attitude toward testing was positive and he showed good effort.   Well Groomed, Alert, and Appropriate.   Test Results:   The patient appeared to put forth good effort throughout the entire testing situation and made no apparent attempts to either minimize or exaggerated any current symptomatology.  This does appear to be a fair and valid assessment of current cognitive functioning.  The patient was administered the Wechsler Adult Intelligence Scale-IV to provide a thorough structured assessment with excellent norms and standardization.  An estimate was made as to the patient's premorbid intellectual and cognitive functioning utilizing education and occupational history along with other psychosocial variables.  The patient completed  both his PhD and MD graduating and the top 10% of his class and medical school and went to [employer] for undergraduate school.  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  Given these variables it is estimated that the patient likely has performed in the superior to's very superior range of intellectual and cognitive functioning throughout his life and we will utilize estimates of functioning for intellectual based processes to be in the 90th-99th percentile for comparison purposes for premorbid intellectual and cognitive functioning.  WAIS-IV             Composite Score  Summary        Scale Sum of Scaled Scores Composite Score Percentile Rank 95% Conf. Interval Qualitative Description  Verbal Comprehension 46 VCI 132 98 125-136 Very Superior  Perceptual Reasoning 43 PRI 125 95 118-130 Superior  Working Memory 24 WMI 111 77 104-117 High Average  Processing Speed 27 PSI 120 91 110-126 Superior  Full Scale 140 FSIQ 129 97 124-132 Superior  General Ability 89 GAI 132 98 126-136 Very Superior      2 composite scores were calculated for this assessment.  The patient produced a full-scale IQ score of 129 which falls at the 97th percentile and is in the superior range and only 1 point below the very superior range.  The patient produced a very good general abilities index score producing a general abilities index score of 132 and following at the 98th percentile and in the very superior range relative to a normative population.  Both of these are excellent scores and consistent with premorbid estimates of intellectual and cognitive functioning suggesting that there are no significant areas of cognitive change in the patient's functioning.           Verbal Comprehension Subtests Summary      Subtest Raw Score Scaled Score Percentile Rank Reference Group Scaled Score SEM  Similarities 34 16 98 16 1.08  Vocabulary 56 18 99.6 19 0.73  Information 19 12 75 14 0.67  (Comprehension) 30 13 84 13 1.08      The patient produced a verbal comprehension index score of 132 which falls at the 98th percentile in the very superior range relative to a normative population.  There was some variability in subtest performance although this was produced due to extremely high scores on measures of verbal reasoning and problem-solving and vocabulary knowledge consistent with his education history and reading hobbies.  The patient performed in the very superior range for verbal reasoning and problem-solving, vocabulary knowledge and in the high average to superior range on measures of  general fund of information and social judgment comprehension.  The patient's general fund of information was his lowest relative score but still performed at the 75th percentile.  This particular subtest assesses factual type base of information that a person would have learned generally in his early educational experiences and while it is still at the 75th percentile is his lowest area of functioning relatively speaking.             Perceptual Reasoning Subtests Summary      Subtest Raw Score Scaled Score Percentile Rank Reference Group Scaled Score SEM  Block Design 44 12 75 10 1.04  Matrix Reasoning 23 16 98 14 0.95  Visual Puzzles 19 15 95 12 0.99  (Figure Weights) 15 12 75 10 0.99  (Picture Completion) 15 12 75 11 1.12      The patient produced a perceptual reasoning  index score of 125 which falls at the 95th percentile and is at the upper end of the superior range relative to a normative population.  Again there was some variability in subtest performance but this was primarily due to very excellent performance on measures of visual reasoning and problem-solving.  The patient also did well on measures of visual analysis and organization, visual estimation and visual prediction capacity as well as his ability to identify visual anomalies within a visual gestalt.  Subtest performance ranged between the 75th percentile and 98th percentile relative to a normative comparison group.             Working Hydrographic surveyor Raw Score Scaled Score Percentile Rank Reference Group Scaled Score SEM  Digit Span 29 11 63 10 0.85  Arithmetic 18 13 84 13 1.04  (Letter-Number Seq.) 24 16 98 14 1.08      The patient produced a working memory index score of 111 which falls at the 77 percentile and is in the high average range relative to a normative population.  This is his lowest relative performing area but did show some variability within subtest performance.  Primary auditory encoding  tends to have a very tight distribution in the population and does not particularly track in a one-to-one relationship with other intellectual and cognitive functioning when she would get above the average to high average range relative to a normative population.  In fact a more demanding challenges requiring patient to actively process information in his auditory register the patient performed exceptionally well.  The subtest letter number sequencing was at the [place] percentile relative to a normative population.  This is in fact the most demanding of the 3 subtest and clearly indicates that the patient possesses an excellent ability to take he had auditory information into his active register and process that information actively.             Processing Speed Subtests Summary     Subtest Raw Score Scaled Score Percentile Rank Reference Group Scaled Score SEM  Symbol Search 39 14 91 12 1.31  Coding 74 13 84 10 0.99  (Cancellation) 45 13 84 11 1.34    The patient produced a processing speed index score of 120 which falls at the 91st percentile and is in the superior range.  The patient did very well on measures requiring visual scanning, visual searching and overall speed of mental operations.  This is a very efficient score and does not suggest any deficits with regard to focus execute abilities and overall speed of mental operations.       WMS-IV           Index Score Summary       Index Sum of Scaled Scores Index Score Percentile Rank 95% Confidence Interval Qualitative Descriptor  Auditory Memory (AMI) 19 124 95 117-129 Superior  Visual Memory (VMI) 55 125 95 118-130 Superior  Visual Working Memory (VWMI) 26 117 87 109-123 High Average  Immediate Memory (IMI) 54 125 95 118-130 Superior  Delayed Memory (DMI) 57 130 98 121-135 Very Superior      The patient was then administered the Wechsler Memory Scale-IV to provide a thorough structured assessment of multiple memory and learning  domains.  On the Wechsler Adult Intelligence Scale the patient performed in the high average range on measures of auditory encoding and performed in the superior range with regard to his ability to process information active auditory register.  The patient performed equally well on measures of visual encoding producing a visual working memory index score of 117 following at the [place] percentile and in the upper end of the high average range relative to a normative population.  There were no indications of either auditory or visual encoding deficits, which are the prerequisites for an individual's capacity to store and organize new information.  Neither of these auditory or visual encoding components should pose a detriment to his ability and capacity to learn new information.  The patient produced an auditory memory index score of 124 which falls at the 95th percentile and is in the superior range.  Very similar performance was achieved for visual memory producing a visual memory index score of 125 which also fell at the 95th percentile and in the superior range relative to normative population.  Breaking down memory functions between immediate versus delayed the patient produced an immediate memory index score of 125 falling at the 95th percentile in the superior range.  Delayed memory index score was 130 following at the 98th percentile in the very superior range.  There was no difference between visual versus auditory memory and the patient actually performed better on delayed memory but all of his memory functions were in the superior to very superior range and the patient clearly showed the ability to effectively encode, organize and store and retrieve both auditory and visual information very effectively.          Primary Subtest Scaled Score Summary     Subtest Domain Raw Score Scaled Score Percentile Rank  Logical Memory I AM 39 15 95  Logical Memory II AM 35 15 95  Verbal Paired Associates I AM 42  13 84  Verbal Paired Associates II AM 12 13 84  Designs I VM 73 12 75  Designs II VM 61 12 75  Visual Reproduction I VM 41 14 91  Visual Reproduction II VM 41 17 99  Spatial Addition VWM 15 13 84  Symbol Span VWM 27 13 84             Auditory Memory Process Score Summary      Process Score Raw Score Scaled Score Percentile Rank Cumulative Percentage (Base Rate)  LM II Recognition 26 - - >75%  VPA II Recognition 39 - - 51-75%             Visual Memory Process Score Summary      Process Score Raw Score Scaled Score Percentile Rank Cumulative Percentage (Base Rate)  DE I Content 35 10 50 -  DE I Spatial 18 11 63 -  DE II Content 38 13 84 -  DE II Spatial 11 10 50 -  DE II Recognition 14 - - 26-50%  VR II Recognition 7 - - >75%     ABILITY-MEMORY ANALYSIS   Ability Score:    GAI: 132 Date of Testing:           WAIS-IV; WMS-IV 2022/02/14             Predicted Difference Method    Index Predicted WMS-IV Index Score Actual WMS-IV Index Score Difference Critical Value   Significant Difference Y/N Base Rate  Auditory Memory 117 124 -7 8.95 N    Visual Memory 119 125 -6 8.82 N    Visual Working Memory 121 117 4 11.24 N    Immediate Memory 121 125 -4 10.35 N    Delayed Memory 119 130 -11 10.08 Y 20%  Statistical  significance (critical value) at the .01 level.         Summary of Results:   Overall, the results of the objective neuropsychological assessment are quite encouraging.  The patient performed consistent with premorbid estimates of intellectual and cognitive functioning essentially across the board.  The patient showed no specific deficits in his lower scores relative to his own performance were on measures of auditory and visual encoding but they were still quite efficient and given the fact that there tends to be a tight distribution in the normative population this pattern does not suggest any type of neuropsychological deficit.  On measures of encoding,  organization, storage and retrieval of new learned information the patient performed very well and consistent with predicted levels of premorbid functioning.  Areas of verbal based skills and knowledge was quite good although his lowest relative area performance had to do with his general fund of information related to facts and knowledge typically learned in younger years and middle and high school types of educational experience.  The still were at the 75th percentile relative to a normative population suggested no specific deficit.  There were clearly no patterns typical of any type of degenerative type of dementia or cognitive loss.  Impression/Diagnosis:   Overall, the results of the current neuropsychological evaluation are quite encouraging.  While the patient describes specific memory weaknesses with regard to episodic memory, these specific memory deficits for events and specific people in this past date back to when he was roughly 55 years of age.  This means that these types of memory deficits have been noticed and described for roughly 25 years without specific notice of loss or progression of this weakness.  The patient showed no deficits with regard to other types of memory functions.  The patient does very well on encoding of both auditory and visual information, excellent procedural memory as indicated by his continued efficient occupational performance, no changes in emotional memory types of functions and very well-maintained declarative memory.  While long-term episodic memory is described as being problematic, the patient does not appear to have any long-term declarative memory deficits.  Semantic memory functions also appear to be well-maintained as indicated by ongoing good work Systems analyst.  As it is very hard to objectively assess in individuals long-term episodic memory, the patient and family have both described difficulties in this 1 specific area.  Hippocampal and medial temporal lobe  regions appear to be working appropriate for all other types of memory and learning components.  I suspect that this is simply a longstanding individual weakness for this specific memory and learning type and not indicative of any type of progressive neurological degenerative process.  The patient does have a a history of ocular type migraine events in the past and theoretically migrainous events could cause issues in some individuals there are no indications on repeat MRIs suggesting these regions have been affected by such an occurrence.  The patient does have indications of past vascular events in cerebellar regions but again these findings could be indicative of some past small and localized infarct.  There is a growing understanding that the cerebellum can play a role in a number of memory and learning domains, particularly the circuitry connecting frontal temporal hippocampal brain regions circuitry interconnecting with the cerebellum.  It is possible that the small cerebellum infarcts are the culprit behind his longstanding nearly 25-year stable experience of specific episodic memory deficits and are related to the specific lacunar infarcts in the cerebellum.  The  patient does not appear to have any widespread cerebellum deficits with no deficits in motor function or coordination or indication of cognitive affective syndrome found in larger cerebellar injuries.  Given the location of these infarcts, particularly the right cerebellar structure and the stability but consistency of episodic memory complaints this particular anatomical region noted on MRI could account for the patient's reported symptoms.  If this is, in fact, the etiological factor in the patient's symptoms over time this would be a stable process and not consistent with a progression or some type of degenerative process.  As we do not know when these changes in cerebellar brain regions occurred is very difficult to hypothesize further.  As  far as treatment recommendations, the patient has had some functional and life issues over the more recent past that he has been addressing including periods of significant sleep disturbance, stress and loss of his son.  I suspect that these particular stressors could potentially have a functional impact more recently but overall his symptoms appear to be more longstanding and persistent and only potentially exacerbated by these functional issues more recently.  I would encourage the patient to continue to work on sleep and building coping skills and adaptive skills around these particular issues.  The patient is being quite diligent about his health and there do not appear to be any cardiovascular or metabolic issues leaving him more vulnerable for future cerebrovascular events.  Diagnosis:    Episodic memory loss  Ocular migraine  Primary insomnia  Cerebrovascular accident (CVA) due to embolism of precerebral artery ([place])   _____________________ Ilean Skill, Psy.D. Clinical Neuropsychologist

## 2022-03-09 ENCOUNTER — Encounter (HOSPITAL_BASED_OUTPATIENT_CLINIC_OR_DEPARTMENT_OTHER): Payer: BC Managed Care – PPO | Admitting: Psychology

## 2022-03-09 DIAGNOSIS — R413 Other amnesia: Secondary | ICD-10-CM | POA: Diagnosis not present

## 2022-03-09 DIAGNOSIS — I6310 Cerebral infarction due to embolism of unspecified precerebral artery: Secondary | ICD-10-CM | POA: Diagnosis not present

## 2022-03-09 DIAGNOSIS — F5101 Primary insomnia: Secondary | ICD-10-CM

## 2022-03-09 DIAGNOSIS — G43109 Migraine with aura, not intractable, without status migrainosus: Secondary | ICD-10-CM

## 2022-03-15 ENCOUNTER — Telehealth (HOSPITAL_COMMUNITY): Payer: Self-pay | Admitting: Emergency Medicine

## 2022-03-15 ENCOUNTER — Encounter: Payer: Self-pay | Admitting: Cardiovascular Disease

## 2022-03-15 ENCOUNTER — Telehealth: Payer: Self-pay

## 2022-03-15 ENCOUNTER — Ambulatory Visit: Payer: BC Managed Care – PPO | Attending: Cardiovascular Disease | Admitting: Cardiovascular Disease

## 2022-03-15 VITALS — BP 120/90 | HR 60 | Ht 72.0 in | Wt 164.0 lb

## 2022-03-15 DIAGNOSIS — I472 Ventricular tachycardia, unspecified: Secondary | ICD-10-CM | POA: Diagnosis not present

## 2022-03-15 DIAGNOSIS — R9431 Abnormal electrocardiogram [ECG] [EKG]: Secondary | ICD-10-CM | POA: Diagnosis not present

## 2022-03-15 DIAGNOSIS — Z01810 Encounter for preprocedural cardiovascular examination: Secondary | ICD-10-CM

## 2022-03-15 NOTE — Patient Instructions (Addendum)
Medication Instructions:  Your physician recommends that you continue on your current medications as directed. Please refer to the Current Medication list given to you today.  *If you need a refill on your cardiac medications before your next appointment, please call your pharmacy*   Lab Work: BMET today If you have labs (blood work) drawn today and your tests are completely normal, you will receive your results only by: MyChart Message (if you have MyChart) OR A paper copy in the mail If you have any lab test that is abnormal or we need to change your treatment, we will call you to review the results.  Testing/Procedures: Coronary CT Angiogram Your physician has requested that you have cardiac CT. Cardiac computed tomography (CT) is a painless test that uses an x-ray machine to take clear, detailed pictures of your heart. For further information please visit [email or web address] Please follow instruction sheet as given.  Follow-Up: At [facility], you and your health needs are our priority.  As part of our continuing mission to provide you with exceptional heart care, we have created designated Provider Care Teams.  These Care Teams include your primary Cardiologist (physician) and Advanced Practice Providers (APPs -  Physician Assistants and Nurse Practitioners) who all work together to provide you with the care you need, when you need it.  We recommend signing up for the patient portal called "MyChart".  Sign up information is provided on this After Visit Summary.  MyChart is used to connect with patients for Virtual Visits (Telemedicine).  Patients are able to view lab/test results, encounter notes, upcoming appointments, etc.  Non-urgent messages can be sent to your provider as well.   To learn more about what you can do with MyChart, go to [email or web address].    Your next appointment:   As Needed  The format for your next appointment:   In Person  Provider:    Tonny Bollman, MD    Other Instructions   Your cardiac CT will be scheduled at :   [facility] [address] [place], [place] [address] [phone]  please arrive at the [facility] and Children's Entrance (Entrance C2) of [facility] 30 minutes prior to test start time. You can use the FREE valet parking offered at entrance C (encouraged to control the heart rate for the test)  Proceed to the [facility] Radiology Department (first floor) to check-in and test prep.  All radiology patients and guests should use entrance C2 at [facility], accessed from [facility], even though the hospital's physical address listed is [address]     Please follow these instructions carefully (unless otherwise directed):  Hold all erectile dysfunction medications at least 3 days (72 hrs) prior to test. (Ie viagra, cialis, sildenafil, tadalafil, etc) We will administer nitroglycerin during this exam.   On the Night Before the Test: Be sure to Drink plenty of water. Do not consume any caffeinated/decaffeinated beverages or chocolate 12 hours prior to your test. Do not take any antihistamines 12 hours prior to your test.  On the Day of the Test: Drink plenty of water until 1 hour prior to the test. Do not eat any food 1 hour prior to test. You may take your regular medications prior to the test.       After the Test: Drink plenty of water. After receiving IV contrast, you may experience a mild flushed feeling. This is normal. On occasion, you may experience a mild rash up  to 24 hours after the test. This is not dangerous. If this occurs, you can take Benadryl 25 mg and increase your fluid intake. If you experience trouble breathing, this can be serious. If it is severe call 911 IMMEDIATELY. If it is mild, please call our office. If you take any of these medications: Glipizide/Metformin, Avandament, Glucavance, please do not take 48 hours  after completing test unless otherwise instructed.  We will call to schedule your test 2-4 weeks out understanding that some insurance companies will need an authorization prior to the service being performed.   For non-scheduling related questions, please contact the cardiac imaging nurse navigator should you have any questions/concerns: Rockwell Alexandria, Cardiac Imaging Nurse Navigator Larey Brick, Cardiac Imaging Nurse Navigator [facility] Direct Office Dial: [phone]   For scheduling needs, including cancellations and rescheduling, please call [place], [phone].   Important Information About Sugar

## 2022-03-15 NOTE — Progress Notes (Signed)
Cardiology Office Note:    Date:  03/15/2022   ID:  Mark Fowler, DOB [birth year], MRN [id number]  PCP:  Marden NobleGates, Robert, MD   [facility] Providers Cardiologist:  None     Referring MD: Marden NobleGates, Robert, MD   Chief Complaint  Patient presents with   Atrial Fibrillation    History of Present Illness:    Mark Fowler is a 56 y.o. male presenting for initial cardiac evaluation. He was added on to my schedule because of the presence of wide-complex tachycardia seen on an outpatient monitor today.  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  He does a lot of cycling and swimming.  He has absolutely no symptoms with physical exertion.  In 2022 he had a CT cardiac calcium score of 0.  He has no family history of coronary artery disease.  His mother had atrial fibrillation in her 9670s.  His father is alive at 6986 with no cardiac problems.  There is no family history of sudden cardiac death or congenital heart disease.  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  As part of his stroke workup he had an echocardiogram with bubble study that showed no abnormalities and his bubble study was negative for PFO.  An event monitor was recommended and he has been noted to have PVCs.  He also was found to have an episode of atrial fibrillation and he was started on apixaban for anticoagulation.  Last night, he was noted to have a 28 beat wide-complex run suspicious for nonsustained ventricular tachycardia.  The patient has experienced some palpitations but otherwise reports no symptoms.  He has no chest pain, chest pressure, shortness of breath, edema, lightheadedness, or syncope.  Past Medical History:  Diagnosis Date   Left inguinal hernia 06/06/2012   Repaired 07/04/12    Medical history non-contributory     Past Surgical History:  Procedure Laterality Date   HERNIA REPAIR   2007   rt ing hernia with mesh   INGUINAL HERNIA REPAIR Left 07/04/2012   Procedure: HERNIA REPAIR INGUINAL ADULT;  Surgeon: Currie Parishristian J Streck, MD;  Location: [facility];  Service: General;  Laterality: Left;   UPPER GI ENDOSCOPY      Current Medications: Current Meds  Medication Sig   ALPRAZolam (XANAX) 0.5 MG tablet Take 0.5-1 mg by mouth as needed.   apixaban (ELIQUIS) 5 MG TABS tablet Take 1 tablet (5 mg total) by mouth 2 (two) times daily.   Folic Acid-Vit B6-Vit B12 (FOLBEE) 2.5-25-1 MG TABS tablet Take 1 tablet by mouth daily.   VITAMIN D PO Take 2,000 Units by mouth. Per patient taking (4) four times a week     Allergies:   Patient has no known allergies.   Social History   Socioeconomic History   Marital status: Married    Spouse name: Not on file   Number of children: Not on file   Years of education: Not on file   Highest education level: Not on file  Occupational History   Not on file  Tobacco Use   Smoking status: Never   Smokeless tobacco: Never  Substance and Sexual Activity   Alcohol use: Not Currently   Drug use: No   Sexual activity: Not on file  Other Topics Concern   Not on file  Social History Narrative   Not on file  Social Determinants of Health   Financial Resource Strain: Not on file  Food Insecurity: Not on file  Transportation Needs: Not on file  Physical Activity: Not on file  Stress: Not on file  Social Connections: Not on file     Family History: The patient's family history includes Alzheimer's disease in his maternal grandfather.  ROS:   Please see the history of present illness.    All other systems reviewed and are negative.  EKGs/Labs/Other Studies Reviewed:    The following studies were reviewed today: Echo 02/27/22:  1. Left ventricular ejection fraction, by estimation, is 60 to 65%. The  left ventricle has normal function. The left ventricle has no regional  wall motion abnormalities. Left ventricular  diastolic parameters were  normal.   2. Right ventricular systolic function is normal. The right ventricular  size is normal. There is normal pulmonary artery systolic pressure.   3. The mitral valve is normal in structure. Mild mitral valve  regurgitation. No evidence of mitral stenosis. 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   4. The aortic valve is normal in structure. Aortic valve regurgitation is  not visualized. No aortic stenosis is present.   5. The inferior vena cava is normal in size with greater than 50%  respiratory variability, suggesting right atrial pressure of 3 mmHg.   6. Agitated saline contrast bubble study was negative, with no evidence  of any interatrial shunt.   EKG:  EKG is ordered today.  The ekg ordered today demonstrates normal sinus rhythm 60 bpm, T wave abnormality consider inferior ischemia, otherwise normal.  Recent Labs: 12/16/2021: ALT 15; BUN 15; Creatinine, Ser 0.78; Hemoglobin 15.2; Platelets 304; Potassium 4.5; Sodium 140; TSH 0.798  Recent Lipid Panel    Component Value Date/Time   CHOL 149 02/16/2022 0806   TRIG 38 02/16/2022 0806   HDL 66 02/16/2022 0806   CHOLHDL 2.3 02/16/2022 0806   LDLCALC 74 02/16/2022 0806     Risk Assessment/Calculations:    CHA2DS2-VASc Score = 2   This indicates a 2.2% annual risk of stroke. The patient's score is based upon: CHF History: 0 HTN History: 0 Diabetes History: 0 Stroke History: 2 Vascular Disease History: 0 Age Score: 0 Gender Score: 0           Physical Exam:    VS:  BP (!) 120/90   Pulse 60   Ht 6' (1.829 m)   Wt 164 lb (74.4 kg)   SpO2 98%   BMI 22.24 kg/m     Wt Readings from Last 3 Encounters:  03/15/22 164 lb (74.4 kg)  12/16/21 164 lb 8 oz (74.6 kg)  08/19/18 164 lb (74.4 kg)     GEN:  Well nourished, well developed physically fit male in no acute distress HEENT: Normal NECK: No JVD; No carotid bruits LYMPHATICS: No lymphadenopathy CARDIAC:  RRR, no murmurs, rubs, gallops RESPIRATORY:  Clear to auscultation without rales, wheezing or rhonchi  ABDOMEN: Soft, non-tender, non-distended MUSCULOSKELETAL:  No edema; No deformity  SKIN: Warm and dry NEUROLOGIC:  Alert and oriented x 3 PSYCHIATRIC:  Normal affect   ASSESSMENT:    1. Abnormal finding on EKG   2. V-tach (HCC)   3. Pre-procedural cardiovascular examination    PLAN:    In order of problems listed above:  Patient with ST and T wave abnormality suspicious for inferior ischemia.  However, he is not having any anginal or even anginal equivalent type symptoms with vigorous  physical exercise.  He had a coronary calcium score of 0 last year.  I have recommended a gated coronary CTA to make sure that he does not have obstructive coronary artery disease or any kind of coronary anomaly that could lead to his abnormal EKG and nonsustained ventricular tachycardia. Long wide-complex run of what I suspect is nonsustained VT.  Other considerations in the differential would be SVT with abberancy.  2D echo is completely normal with normal LV wall thickness, no outflow obstruction, no valvular disease, and normal LVEF.  Recommend coronary CTA to evaluate for ischemic heart disease.  EP consultation is pending in January.  I considered a beta-blocker for this patient, but he tells me he is resting heart rate is usually in the 40s and 50s and I do not think he would tolerate this well.           Medication Adjustments/Labs and Tests Ordered: Current medicines are reviewed at length with the patient today.  Concerns regarding medicines are outlined above.  Orders Placed This Encounter  Procedures   CT CORONARY MORPH W/CTA COR W/SCORE W/CA W/CM &/OR WO/CM   Basic metabolic panel   EKG 12-Lead   No orders of the defined types were placed in this encounter.   Patient Instructions  Medication Instructions:  Your physician recommends that you continue on your current medications as  directed. Please refer to the Current Medication list given to you today.  *If you need a refill on your cardiac medications before your next appointment, please call your pharmacy*   Lab Work: BMET today If you have labs (blood work) drawn today and your tests are completely normal, you will receive your results only by: MyChart Message (if you have MyChart) OR A paper copy in the mail If you have any lab test that is abnormal or we need to change your treatment, we will call you to review the results.  Testing/Procedures: Coronary CT Angiogram Your physician has requested that you have cardiac CT. Cardiac computed tomography (CT) is a painless test that uses an x-ray machine to take clear, detailed pictures of your heart. For further information please visit [email or web address] Please follow instruction sheet as given.  Follow-Up: At [facility], you and your health needs are our priority.  As part of our continuing mission to provide you with exceptional heart care, we have created designated Provider Care Teams.  These Care Teams include your primary Cardiologist (physician) and Advanced Practice Providers (APPs -  Physician Assistants and Nurse Practitioners) who all work together to provide you with the care you need, when you need it.  We recommend signing up for the patient portal called "MyChart".  Sign up information is provided on this After Visit Summary.  MyChart is used to connect with patients for Virtual Visits (Telemedicine).  Patients are able to view lab/test results, encounter notes, upcoming appointments, etc.  Non-urgent messages can be sent to your provider as well.   To learn more about what you can do with MyChart, go to [email or web address].    Your next appointment:   As Needed  The format for your next appointment:   In Person  Provider:   Tonny Bollman, MD    Other Instructions   Your cardiac CT will be scheduled at :   [facility] [address] [place], [place] [address] [phone]  please arrive at the [facility] Children's Entrance (Entrance C2) of [facility] 30 minutes prior to test start  time. You can use the FREE valet parking offered at entrance C (encouraged to control the heart rate for the test)  Proceed to the [facility] Radiology Department (first floor) to check-in and test prep.  All radiology patients and guests should use entrance C2 at [facility] accessed from [facility], even though the hospital's physical address listed is [address].     Please follow these instructions carefully (unless otherwise directed):  Hold all erectile dysfunction medications at least 3 days (72 hrs) prior to test. (Ie viagra, cialis, sildenafil, tadalafil, etc) We will administer nitroglycerin during this exam.   On the Night Before the Test: Be sure to Drink plenty of water. Do not consume any caffeinated/decaffeinated beverages or chocolate 12 hours prior to your test. Do not take any antihistamines 12 hours prior to your test.  On the Day of the Test: Drink plenty of water until 1 hour prior to the test. Do not eat any food 1 hour prior to test. You may take your regular medications prior to the test.       After the Test: Drink plenty of water. After receiving IV contrast, you may experience a mild flushed feeling. This is normal. On occasion, you may experience a mild rash up to 24 hours after the test. This is not dangerous. If this occurs, you can take Benadryl 25 mg and increase your fluid intake. If you experience trouble breathing, this can be serious. If it is severe call 911 IMMEDIATELY. If it is mild, please call our office. If you take any of these medications: Glipizide/Metformin, Avandament, Glucavance, please do not take 48 hours after completing test unless otherwise instructed.  We will call to schedule your test 2-4 weeks out  understanding that some insurance companies will need an authorization prior to the service being performed.   For non-scheduling related questions, please contact the cardiac imaging nurse navigator should you have any questions/concerns: Rockwell Alexandria, Cardiac Imaging Nurse Navigator Larey Brick, Cardiac Imaging Nurse Navigator [place] [facility] Direct Office Dial: [phone]   For scheduling needs, including cancellations and rescheduling, please call [place], [phone].   Important Information About Sugar         Signed, Tonny Bollman, MD  03/15/2022 9:08 PM    [facility]

## 2022-03-15 NOTE — Telephone Encounter (Signed)
   Cardiac Monitor Alert  Date of alert:  03/15/2022   Patient Name: Mark Fowler  DOB: 02/29/1966  MRN: [id number]   [facility] Cardiologist: None [facility] EP:  None    Monitor Information: Cardiac Event Monitor [[employer]]  Reason:  Cerebral Infarction, Unspecified Atrial Fibrillation. Ordering provider:  [name];  Dr Elberta Fortis name assigned to [employer] Cardiac Reporting.    Alert Ventricular Tachycardia (11 Sec) with PVC captured, at 702 pm on 03/14/2022.   Other: Cardiac Alert Monitor Report received 12/13/ 2023 at 800 am in [place] on [employer]  Ventricular Tachycardia  (11 Sec) with PVC captured, at 702 pm on 03/14/2022.  I contacted Pt via telephone, and Pt stated he had felt the run of VT,with palpitations.  No other symptoms were reported by Dr. Molli Posey.  Pt had two other events captured on 11/30, VT 8 Beat Run, and 12/4 Atrial Fib with RVR non-sustained.    VT captured 03/14/2022 at 702 pm taken to DOD, Dr. Excell Seltzer for assessment.  Pt has upcoming appointment with Dr. Carleene Mains on 04/05/22 at 1200 pm.  Per Dr. Excell Seltzer, wants Dr. Kennith Center to be seen in his clinic today at 1140 am for assessment of monitor captures. Dr. Molli Posey contacted, and confirmed that he can come into see Dr. Excell Seltzer at [facility] on [facility].  Confirmation of appointment communicated to Dr. Excell Seltzer.     Elmore Guise, RN  03/15/2022 9:05 AM

## 2022-03-15 NOTE — Telephone Encounter (Signed)
Reaching out to patient to offer assistance regarding upcoming cardiac imaging study; pt verbalizes understanding of appt date/time, parking situation and where to check in, pre-test NPO status and medications ordered, and verified current allergies; name and call back number provided for further questions should they arise Rockwell Alexandria RN Navigator Cardiac Imaging [facility] [phone] office [phone] cell  Arrival 730 Denies iv issues HR 55-65 without meds Aware contrast/nitro

## 2022-03-16 ENCOUNTER — Ambulatory Visit: Payer: BC Managed Care – PPO | Admitting: Psychology

## 2022-03-16 LAB — BASIC METABOLIC PANEL
BUN/Creatinine Ratio: 15 (ref 9–20)
BUN: 15 mg/dL (ref 6–24)
CO2: 24 mmol/L (ref 20–29)
Calcium: 9.7 mg/dL (ref 8.7–10.2)
Chloride: 104 mmol/L (ref 96–106)
Creatinine, Ser: 0.98 mg/dL (ref 0.76–1.27)
Glucose: 84 mg/dL (ref 70–99)
Potassium: 4.1 mmol/L (ref 3.5–5.2)
Sodium: 143 mmol/L (ref 134–144)
eGFR: 89 mL/min/{1.73_m2} (ref >59)

## 2022-03-19 ENCOUNTER — Encounter: Payer: Self-pay | Admitting: Psychology

## 2022-03-19 NOTE — Progress Notes (Signed)
Neuropsychology Visit  Patient:  Mark Fowler   DOB: Jun 10, 1965  MR Number: [id number]  Location: [facility] [place], [place] [id number] [facility] [place] [address] Dept: [phone]  Date of Service: 03/09/2022  Start: 11 AM End: 12 PM  Today's visit was a 1 hour visit conducted in my outpatient clinic office.  Today I provided feedback regarding the results of the recent neuropsychological evaluation and we worked through therapeutic interpretation and working on Radiographer, therapeutic and strategies going forward regarding this diagnosis  Duration of Service: 1 Hour  Provider/Observer:     Edgardo Roys PsyD  Chief Complaint:      Chief Complaint  Patient presents with   Memory Loss   Sleeping Problem   Headache    Reason For Service:      Mark Fowler is a 56 year old male referred for neuropsychological evaluation by his treating psychiatrist Sarina Ill, MD as part of a larger neurological work-up requested by his PCP Josetta Huddle, MD.  The purpose for this evaluation was due to reports of concern around long-term memory loss and deficits.  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  Patient denies any other cognitive changes such as changes in visual spatial or visual constructional capacity, changes in executive functioning or decision-making, difficulties managing day-to-day life functions and work functions.  The patient reports that he has not noticed any specific changes or worsening in the symptoms in the past couple of years.  The patient does report that major life events in the past even going back to his childhood through high school years in college are not remembered at all when he interacts with lifelong friends and they  discussed major events that they experience together.  The patient reports that it has gotten to the point that it was even a joke about his lack of ability to remember people and events from his past.  The patient noted that he first became aware of this when he went to his 15-year high school reunion and the symptoms have persisted.   The patient has a very limited past medical history other than a history of migraines.  The patient is in very good overall health other than history of ocular migraines and episodes in the past related to significant primary insomnia.  The patient has participated in sleep related cognitive behavioral therapies that produced significant benefit with significant improvement in his sleep pattern.  The patient has no indication of any obstructive sleep apnea.  Patient denies any visual hallucinations/delusions, tremors or changes in gait or motor functions.  There is no family history of progressive degenerative process and his father is 56 years old and in good physical and cognitive health.  Patient's mother passed away at 26 without any memory issues and his 28 year old sister is doing quite well.  Developmentally, the patient was always an excellent student and there was no indication of any learning disabilities or difficulties.  The patient's son has been diagnosed with a central auditory processing disorder.   In the patient's descriptions of his current memory difficulties, he reports that there has been little to no change of this over the past year or 2.  The patient reports that this first was noticed when he went to his 15-year high school reunion and interacted  with people that he had a long history of interacting and knowing and could not remember them.  The patient has had instances where friends or family have described a major life events that the patient was not able to have any memory for.  However, the patient appears to have a very good memory for for the times  where he did not remember something and was able to describe in detail the setting and occurrence of these events.  The patient reports that all of the memories really revolve around life experiences and not factual types of information.  These memory difficulties are almost exclusively around remote events and people that have been in his life that he feels like he should have clear memories.   The patient has had an brain MRI in 2007/09 due to new onset of episodic vertigo and a repeat MRI in 2023.  2007 MRI showed no significant acute process with only an unchanged solitary focus of increased T2 and FLAIR signal in the left centrum semiovale that was felt to be of doubtful clinical significance.  There was no other indication of small vessel disease or microvascular ischemic changes.  Patient had a repeat MRI performed on 01/06/2022 with comparisons to had MRIs performed in 2009 and 2007.  No acute process was noted and no significant white matter lesions were present.  No abnormalities noted in temporal lobes or hippocampal structures.  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  Otherwise it was a normal MRI.  The patient is having further work-ups for various potential's for this previous lacunar infarct and all potential causes by Dr. Lucia Gaskins but it is possible that this finding has been there for a long time.  Patient denies any history of concussive events or any acute changes in motor function or motor coordination/changes in gait.   Patient does have a history of acute difficulties with insomnia that started roughly in 2005 and in its most significant form lasting approximately 6 months.  This is likely related to significant sleep disturbance related to work responsibilities.  The patient acknowledges that historically he has suffered bouts of insomnia and recently went through a CBT-I program with  good results for his sleep pattern and has worked on sleep hygiene issues.  Patient reports that appetite is good and that he has a very good diet and that he regularly exercises.  The patient reports that he typically has 1 glass of scotch in the evenings while reading and other variables for limited alcohol use are related to life events and social activities.  No other substance use is noted.  Current stressors include relationship issues/life stressors, demanding work schedule and still ongoing coping with the death of his son from suicide in 2017.  Treatment Interventions:  Today I provided feedback regarding the results of the recent neuropsychological evaluation and we addressed recommendations going forward as far as treatment recommendations and process the implications of this current evaluation.  Participation Level:   Active  Participation Quality:  Appropriate      Behavioral Observation:  Well Groomed, Alert, and Appropriate.   I have included a copy of the impression/diagnosis from the formal neuropsychological evaluation below for convenience and it can be found in the patient's EMR dated 03/07/2022.  Impression/Diagnosis:                     Overall, the  results of the current neuropsychological evaluation are quite encouraging.  While the patient describes specific memory weaknesses with regard to episodic memory, these specific memory deficits for events and specific people in this past date back to when he was roughly 56 years of age.  This means that these types of memory deficits have been noticed and described for roughly 25 years without specific notice of loss or progression of this weakness.  The patient showed no deficits with regard to other types of memory functions.  The patient does very well on encoding of both auditory and visual information, excellent procedural memory as indicated by his continued efficient occupational performance, no changes in emotional memory types  of functions and very well-maintained declarative memory.  While long-term episodic memory is described as being problematic, the patient does not appear to have any long-term declarative memory deficits.  Semantic memory functions also appear to be well-maintained as indicated by ongoing good work International aid/development worker.  As it is very hard to objectively assess in individuals long-term episodic memory, the patient and family have both described difficulties in this 1 specific area.  Hippocampal and medial temporal lobe regions appear to be working appropriate for all other types of memory and learning components.  I suspect that this is simply a longstanding individual weakness for this specific memory and learning type and not indicative of any type of progressive neurological degenerative process.  The patient does have a a history of ocular type migraine events in the past and theoretically migrainous events could cause issues in some individuals there are no indications on repeat MRIs suggesting these regions have been affected by such an occurrence.  The patient does have indications of past vascular events in cerebellar regions but again these findings could be indicative of some past small and localized infarct.  There is a growing understanding that the cerebellum can play a role in a number of memory and learning domains, particularly the circuitry connecting frontal temporal hippocampal brain regions circuitry interconnecting with the cerebellum.  It is possible that the small cerebellum infarcts are the culprit behind his longstanding nearly 25-year stable experience of specific episodic memory deficits and are related to the specific lacunar infarcts in the cerebellum.  The patient does not appear to have any widespread cerebellum deficits with no deficits in motor function or coordination or indication of cognitive affective syndrome found in larger cerebellar injuries.  Given the location of these infarcts,  particularly the right cerebellar structure and the stability but consistency of episodic memory complaints this particular anatomical region noted on MRI could account for the patient's reported symptoms.  If this is, in fact, the etiological factor in the patient's symptoms over time this would be a stable process and not consistent with a progression or some type of degenerative process.  As we do not know when these changes in cerebellar brain regions occurred is very difficult to hypothesize further.   As far as treatment recommendations, the patient has had some functional and life issues over the more recent past that he has been addressing including periods of significant sleep disturbance, stress and loss of his son.  I suspect that these particular stressors could potentially have a functional impact more recently but overall his symptoms appear to be more longstanding and persistent and only potentially exacerbated by these functional issues more recently.  I would encourage the patient to continue to work on sleep and building coping skills and adaptive skills around these particular issues.  The  patient is being quite diligent about his health and there do not appear to be any cardiovascular or metabolic issues leaving him more vulnerable for future cerebrovascular events.   Diagnosis:                                Episodic memory loss   Ocular migraine   Primary insomnia   Cerebrovascular accident (CVA) due to embolism of precerebral artery ([place])     _____________________ Ilean Skill, Psy.D. Clinical Psychologist Neuropsychologist

## 2022-03-21 ENCOUNTER — Ambulatory Visit (HOSPITAL_COMMUNITY)
Admission: RE | Admit: 2022-03-21 | Discharge: 2022-03-21 | Disposition: A | Discharge status: Home / Self Care | Payer: BC Managed Care – PPO | Source: Ambulatory Visit | Attending: Cardiovascular Disease | Admitting: Cardiovascular Disease

## 2022-03-21 DIAGNOSIS — R9431 Abnormal electrocardiogram [ECG] [EKG]: Secondary | ICD-10-CM | POA: Insufficient documentation

## 2022-03-21 DIAGNOSIS — I472 Ventricular tachycardia, unspecified: Secondary | ICD-10-CM | POA: Diagnosis not present

## 2022-03-21 MED ORDER — NITROGLYCERIN 0.4 MG SL SUBL
SUBLINGUAL_TABLET | SUBLINGUAL | Status: AC
  Filled 2022-03-21: qty 2

## 2022-03-21 MED ORDER — NITROGLYCERIN 0.4 MG SL SUBL
0.8000 mg | SUBLINGUAL_TABLET | Freq: Once | SUBLINGUAL | Status: AC
  Administered 2022-03-21: 0.8 mg via SUBLINGUAL

## 2022-03-21 MED ORDER — IOHEXOL 350 MG/ML SOLN
100.0000 mL | Freq: Once | INTRAVENOUS | Status: AC | PRN
  Administered 2022-03-21: 100 mL via INTRAVENOUS

## 2022-03-30 ENCOUNTER — Telehealth: Payer: Self-pay | Admitting: Cardiovascular Disease

## 2022-03-30 NOTE — Telephone Encounter (Signed)
Patient stated his last heart monitor tracing done on 12/12 is not showing up in Epic and he is following-up on this.

## 2022-03-30 NOTE — Telephone Encounter (Signed)
We do not follow this cardiac event monitor here in EP.   Will forward to gen cards to evaluate and follow up. Thanks.

## 2022-03-31 NOTE — Telephone Encounter (Signed)
Forwarding to Dr. Excell Seltzer.  Thanks.

## 2022-04-01 ENCOUNTER — Encounter: Payer: Self-pay | Admitting: Cardiovascular Disease

## 2022-04-05 ENCOUNTER — Encounter: Payer: Self-pay | Admitting: Cardiology

## 2022-04-05 ENCOUNTER — Ambulatory Visit: Payer: BC Managed Care – PPO | Attending: Cardiology | Admitting: Cardiology

## 2022-04-05 VITALS — BP 114/66 | HR 58 | Ht 72.0 in | Wt 169.6 lb

## 2022-04-05 DIAGNOSIS — R9431 Abnormal electrocardiogram [ECG] [EKG]: Secondary | ICD-10-CM

## 2022-04-05 DIAGNOSIS — I472 Ventricular tachycardia, unspecified: Secondary | ICD-10-CM

## 2022-04-05 NOTE — Patient Instructions (Addendum)
Medication Instructions:  Your physician has recommended you make the following change in your medication:  STOP Eliquis  *If you need a refill on your cardiac medications before your next appointment, please call your pharmacy*   Lab Work: None ordered   Testing/Procedures: Your physician has requested that you have a cardiac MRI. Cardiac MRI uses a computer to create images of your heart as its beating, producing both still and moving pictures of your heart and major blood vessels. For further information please visit [email or web address] Please follow the instruction sheet given to you today for more information.   Follow-Up: At [facility], you and your health needs are our priority.  As part of our continuing mission to provide you with exceptional heart care, we have created designated Provider Care Teams.  These Care Teams include your primary Cardiologist (physician) and Advanced Practice Providers (APPs -  Physician Assistants and Nurse Practitioners) who all work together to provide you with the care you need, when you need it.   Your next appointment:   To be  determined --- will call you to schedule you to come back for loop recorder implant  The format for your next appointment:   In Person  Provider:   Allegra Lai, MD    Thank you for choosing [facility]!!   Trinidad Curet, RN [phone]  Other Instructions   Important Information About Sugar

## 2022-04-05 NOTE — Progress Notes (Signed)
Electrophysiology Office Note   Date:  04/05/2022   ID:  Mark Fowler, DOB 06-29-1965, MRN [id number]  PCP:  Josetta Huddle, MD  Cardiologist:  Burt Knack Primary Electrophysiologist:  Allysha Tryon Meredith Leeds, MD    Chief Complaint: AF, VT   History of Present Illness: Mark Fowler is a 57 y.o. male who is being seen today for the evaluation of AF, VT at the request of Melvenia Beam, MD. Presenting today for electrophysiology evaluation.  He has no obvious cardiac medical history.  He did have an MRI of his brain 01/06/2022 that demonstrated remote cerebellar lacunar infarcts that have been present since 2007.  As part of the stroke workup he had an echo, bubble study showed no abnormalities.  An event monitor that showed an episode of atrial fibrillation.  He also had a 28 beat run of wide-complex tachycardia.  He feels well today.  He does state that he was aware of both his atrial fibrillation and PVCs.  He felt palpitations each time.  During the episode of VT, he could tell that the episode was quite shorter than his episode of atrial fibrillation.Marland Kitchen  He does mountain biking.  He is currently on Eliquis, but he is concerned about this as he lives a quite active lifestyle.  Today, he denies symptoms of palpitations, chest pain, shortness of breath, orthopnea, PND, lower extremity edema, claudication, dizziness, presyncope, syncope, bleeding, or neurologic sequela. The patient is tolerating medications without difficulties.    Past Medical History:  Diagnosis Date   Left inguinal hernia 06/06/2012   Repaired 07/04/12    Medical history non-contributory    Past Surgical History:  Procedure Laterality Date   HERNIA REPAIR  2007   rt ing hernia with mesh   INGUINAL HERNIA REPAIR Left 07/04/2012   Procedure: HERNIA REPAIR INGUINAL ADULT;  Surgeon: Haywood Lasso, MD;  Location: [facility];  Service: General;  Laterality: Left;   UPPER GI ENDOSCOPY       Current  Outpatient Medications  Medication Sig Dispense Refill   ALPRAZolam (XANAX) 0.5 MG tablet Take 0.5-1 mg by mouth as needed.     Folic Acid-Vit D1-SHF W26 (FOLBEE) 2.5-25-1 MG TABS tablet Take 1 tablet by mouth daily. 90 tablet 3   VITAMIN D PO Take 2,000 Units by mouth. Per patient taking (4) four times a week     No current facility-administered medications for this visit.    Allergies:   Patient has no known allergies.   Social History:  The patient  reports that he has never smoked. He has never used smokeless tobacco. He reports that he does not currently use alcohol. He reports that he does not use drugs.   Family History:  The patient's family history includes Alzheimer's disease in his maternal grandfather.    ROS:  Please see the history of present illness.   Otherwise, review of systems is positive for none.   All other systems are reviewed and negative.    PHYSICAL EXAM: VS:  BP 114/66   Pulse (!) 58   Ht 6' (1.829 m)   Wt 169 lb 9.6 oz (76.9 kg)   SpO2 97%   BMI 23.00 kg/m  , BMI Body mass index is 23 kg/m. GEN: Well nourished, well developed, in no acute distress  HEENT: normal  Neck: no JVD, carotid bruits, or masses Cardiac: RRR; no murmurs, rubs, or gallops,no edema  Respiratory:  clear to auscultation bilaterally, normal work of breathing GI:  soft, nontender, nondistended, + BS MS: no deformity or atrophy  Skin: warm and dry Neuro:  Strength and sensation are intact Psych: euthymic mood, full affect  EKG:  EKG is not ordered today. Personal review of the ekg ordered 03/15/22 shows sinus rhythm, rate 60, inferior TWI  Recent Labs: 12/16/2021: ALT 15; Hemoglobin 15.2; Platelets 304; TSH 0.798 03/15/2022: BUN 15; Creatinine, Ser 0.98; Potassium 4.1; Sodium 143    Lipid Panel     Component Value Date/Time   CHOL 149 02/16/2022 0806   TRIG 38 02/16/2022 0806   HDL 66 02/16/2022 0806   CHOLHDL 2.3 02/16/2022 0806   LDLCALC 74 02/16/2022 0806     Wt  Readings from Last 3 Encounters:  04/05/22 169 lb 9.6 oz (76.9 kg)  03/15/22 164 lb (74.4 kg)  12/16/21 164 lb 8 oz (74.6 kg)      Other studies Reviewed: Additional studies/ records that were reviewed today include: TTE 02/27/22  Review of the above records today demonstrates:   1. Left ventricular ejection fraction, by estimation, is 60 to 65%. The  left ventricle has normal function. The left ventricle has no regional  wall motion abnormalities. Left ventricular diastolic parameters were  normal.   2. Right ventricular systolic function is normal. The right ventricular  size is normal. There is normal pulmonary artery systolic pressure.   3. The mitral valve is normal in structure. Mild mitral valve  regurgitation. No evidence of mitral stenosis. 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   4. The aortic valve is normal in structure. Aortic valve regurgitation is  not visualized. No aortic stenosis is present.   5. The inferior vena cava is normal in size with greater than 50%  respiratory variability, suggesting right atrial pressure of 3 mmHg.   6. Agitated saline contrast bubble study was negative, with no evidence  of any interatrial shunt.   Cardiac monitor 03/29/2022 personally reviewed Heart rhythm was sinus rhythm Less than 1% atrial fibrillation burden Ventricular tachycardia, 28 beats  ASSESSMENT AND PLAN:  1.  Ventricular tachycardia:  He is at low risk for issues with his ventricular tachycardia due to a normal ejection fraction and a coronary calcium score of 0.  To rule out scar, we Temitayo Covalt plan for cardiac MRI.  If there is no scar, we Ames Hoban continue with current management and monitor.  2.  Atrial fibrillation: Noted on cardiac monitor.  Currently on Eliquis 5 mg twice daily.  CHA2DS2-VASc of 2.  Has had a lacunar infarct.  He did have palpitations around the time of his episode of atrial fibrillation.  That being said, his episode was quite  short.  As he is quite active, and doing mountain biking, he is quite hesitant to continue Eliquis.  Rhylee Nunn stop Eliquis today and plan for ILR implant.  Case discussed with referring physician  Current medicines are reviewed at length with the patient today.   The patient does not have concerns regarding his medicines.  The following changes were made today:  stop eliquis  Labs/ tests ordered today include:  Orders Placed This Encounter  Procedures   MR CARDIAC MORPHOLOGY W WO CONTRAST     Disposition:   FU with Chrissie Dacquisto 6 months  Signed, Royce Stegman Meredith Leeds, MD  04/05/2022 12:38 PM     [facility] [address] [place]  [id number] [phone] (office) [phone] (fax)

## 2022-04-07 NOTE — Telephone Encounter (Signed)
Pt seen 04/05/2022

## 2022-04-19 DIAGNOSIS — I493 Ventricular premature depolarization: Secondary | ICD-10-CM | POA: Diagnosis not present

## 2022-04-19 DIAGNOSIS — I4729 Other ventricular tachycardia: Secondary | ICD-10-CM | POA: Diagnosis not present

## 2022-04-19 DIAGNOSIS — I480 Paroxysmal atrial fibrillation: Secondary | ICD-10-CM | POA: Diagnosis not present

## 2022-04-26 ENCOUNTER — Encounter: Payer: Self-pay | Admitting: Cardiology

## 2022-05-08 ENCOUNTER — Telehealth (HOSPITAL_COMMUNITY): Payer: Self-pay | Admitting: Emergency Medicine

## 2022-05-08 NOTE — Telephone Encounter (Signed)
Reaching out to patient to offer assistance regarding upcoming cardiac imaging study; pt verbalizes understanding of appt date/time, parking situation and where to check in, pre-test NPO status and medications ordered, and verified current allergies; name and call back number provided for further questions should they arise Marchia Bond RN Navigator Cardiac Imaging [facility] [phone] office [phone] cell  Arrival [address] Denies claustro Denies iv issues Metal to shoulder after surgery- has had MR x 2 since surgery with no issues Daily meds per usual

## 2022-05-09 ENCOUNTER — Other Ambulatory Visit: Payer: Self-pay | Admitting: Cardiology

## 2022-05-09 ENCOUNTER — Ambulatory Visit (HOSPITAL_COMMUNITY)
Admission: RE | Admit: 2022-05-09 | Discharge: 2022-05-09 | Disposition: A | Discharge status: Home / Self Care | Payer: BC Managed Care – PPO | Source: Ambulatory Visit | Attending: Cardiology | Admitting: Cardiology

## 2022-05-09 DIAGNOSIS — I472 Ventricular tachycardia, unspecified: Secondary | ICD-10-CM | POA: Diagnosis not present

## 2022-05-09 MED ORDER — GADOBUTROL 1 MMOL/ML IV SOLN
8.0000 mL | Freq: Once | INTRAVENOUS | Status: AC | PRN
  Administered 2022-05-09: 8 mL via INTRAVENOUS

## 2022-05-12 ENCOUNTER — Ambulatory Visit: Payer: BC Managed Care – PPO | Admitting: Cardiology

## 2022-05-29 ENCOUNTER — Telehealth: Payer: Self-pay | Admitting: *Deleted

## 2022-05-29 ENCOUNTER — Ambulatory Visit: Payer: BC Managed Care – PPO | Admitting: Cardiology

## 2022-05-29 DIAGNOSIS — I472 Ventricular tachycardia, unspecified: Secondary | ICD-10-CM

## 2022-05-29 DIAGNOSIS — Q238 Other congenital malformations of aortic and mitral valves: Secondary | ICD-10-CM

## 2022-05-29 NOTE — Telephone Encounter (Signed)
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  With history of ventricular tachycardia and posterior medial papillary muscle delayed imaging, would refer to [facility] and Dr. Derinda Sis.

## 2022-05-29 NOTE — Telephone Encounter (Signed)
Informed patient of results and verbal understanding expressed. Aware Dr. Curt Bears does not want to proceed with ILR implant until he sees Dr. Derinda Sis, aware appt with [place] this afternoon will be cancelled. Pt will let us know once he gets scheduled to see [facility] MD. He appreciates the call.

## 2022-07-03 ENCOUNTER — Inpatient Hospital Stay (HOSPITAL_COMMUNITY): Admission: RE | Admit: 2022-07-03 | Payer: BC Managed Care – PPO | Source: Ambulatory Visit

## 2022-07-20 DIAGNOSIS — I4729 Other ventricular tachycardia: Secondary | ICD-10-CM | POA: Diagnosis not present

## 2022-07-20 DIAGNOSIS — I341 Nonrheumatic mitral (valve) prolapse: Secondary | ICD-10-CM | POA: Diagnosis not present

## 2022-07-20 DIAGNOSIS — I480 Paroxysmal atrial fibrillation: Secondary | ICD-10-CM | POA: Diagnosis not present

## 2022-08-02 ENCOUNTER — Ambulatory Visit: Payer: BC Managed Care – PPO | Admitting: Psychology

## 2022-08-14 ENCOUNTER — Ambulatory Visit: Payer: BC Managed Care – PPO | Admitting: Psychology

## 2022-08-21 ENCOUNTER — Ambulatory Visit: Payer: BC Managed Care – PPO | Admitting: Cardiology

## 2022-09-01 ENCOUNTER — Ambulatory Visit: Payer: BC Managed Care – PPO | Admitting: Cardiology

## 2022-09-11 ENCOUNTER — Emergency Department (HOSPITAL_COMMUNITY): Payer: BC Managed Care – PPO

## 2022-09-11 ENCOUNTER — Emergency Department (HOSPITAL_COMMUNITY)
Admission: EM | Admit: 2022-09-11 | Discharge: 2022-09-11 | Disposition: A | Discharge status: Home / Self Care | Payer: BC Managed Care – PPO | Attending: Emergency Medicine | Admitting: Emergency Medicine

## 2022-09-11 ENCOUNTER — Other Ambulatory Visit: Payer: Self-pay

## 2022-09-11 ENCOUNTER — Encounter (HOSPITAL_COMMUNITY): Payer: Self-pay

## 2022-09-11 DIAGNOSIS — I480 Paroxysmal atrial fibrillation: Secondary | ICD-10-CM

## 2022-09-11 DIAGNOSIS — R002 Palpitations: Secondary | ICD-10-CM

## 2022-09-11 DIAGNOSIS — R000 Tachycardia, unspecified: Secondary | ICD-10-CM | POA: Diagnosis not present

## 2022-09-11 DIAGNOSIS — I4891 Unspecified atrial fibrillation: Secondary | ICD-10-CM | POA: Insufficient documentation

## 2022-09-11 HISTORY — DX: Unspecified atrial fibrillation: I48.91

## 2022-09-11 LAB — BASIC METABOLIC PANEL
Anion gap: 9 (ref 5–15)
BUN: 20 mg/dL (ref 6–20)
CO2: 26 mmol/L (ref 22–32)
Calcium: 9.4 mg/dL (ref 8.9–10.3)
Chloride: 107 mmol/L (ref 98–111)
Creatinine, Ser: 0.95 mg/dL (ref 0.61–1.24)
GFR, Estimated: >60 mL/min (ref >60)
Glucose, Bld: 103 mg/dL — ABNORMAL HIGH (ref 70–99)
Potassium: 3.7 mmol/L (ref 3.5–5.1)
Sodium: 142 mmol/L (ref 135–145)

## 2022-09-11 LAB — CBC
HCT: 51.1 % (ref 39.0–52.0)
Hemoglobin: 17.4 g/dL — ABNORMAL HIGH (ref 13.0–17.0)
MCH: 31.3 pg (ref 26.0–34.0)
MCHC: 34.1 g/dL (ref 30.0–36.0)
MCV: 91.9 fL (ref 80.0–100.0)
Platelets: 224 10*3/uL (ref 150–400)
RBC: 5.56 MIL/uL (ref 4.22–5.81)
RDW: 12.2 % (ref 11.5–15.5)
WBC: 7.6 10*3/uL (ref 4.0–10.5)
nRBC: 0 % (ref 0.0–0.2)

## 2022-09-11 LAB — MAGNESIUM: Magnesium: 2.2 mg/dL (ref 1.7–2.4)

## 2022-09-11 MED ORDER — SODIUM CHLORIDE 0.9 % IV BOLUS (SEPSIS)
1000.0000 mL | Freq: Once | INTRAVENOUS | Status: AC
  Administered 2022-09-11: 1000 mL via INTRAVENOUS

## 2022-09-11 MED ORDER — POTASSIUM CHLORIDE CRYS ER 20 MEQ PO TBCR
20.0000 meq | EXTENDED_RELEASE_TABLET | Freq: Once | ORAL | Status: AC
  Administered 2022-09-11: 20 meq via ORAL
  Filled 2022-09-11: qty 1

## 2022-09-11 MED ORDER — POTASSIUM CHLORIDE 10 MEQ/100ML IV SOLN
10.0000 meq | Freq: Once | INTRAVENOUS | Status: AC
  Administered 2022-09-11: 10 meq via INTRAVENOUS
  Filled 2022-09-11: qty 100

## 2022-09-11 MED ORDER — METOPROLOL TARTRATE 5 MG/5ML IV SOLN
5.0000 mg | Freq: Once | INTRAVENOUS | Status: AC
  Administered 2022-09-11: 5 mg via INTRAVENOUS
  Filled 2022-09-11: qty 5

## 2022-09-11 MED ORDER — SODIUM CHLORIDE 0.9 % IV SOLN
1000.0000 mL | INTRAVENOUS | Status: DC
  Administered 2022-09-11: 1000 mL via INTRAVENOUS

## 2022-09-11 MED ORDER — METOPROLOL TARTRATE 25 MG PO TABS
25.0000 mg | ORAL_TABLET | Freq: Once | ORAL | Status: AC
  Administered 2022-09-11: 25 mg via ORAL
  Filled 2022-09-11: qty 1

## 2022-09-11 MED ORDER — APIXABAN 5 MG PO TABS: 5.0000 mg | ORAL_TABLET | Freq: Two times a day (BID) | ORAL | 0 refills | Status: DC

## 2022-09-11 NOTE — Discharge Instructions (Addendum)
Please start taking the Eliquis as you discussed with cardiology and follow-up with them in the A-fib clinic.  Please rest and stay hydrated.  If any symptoms change or worsen acutely, please return to the nearest emergency department.

## 2022-09-11 NOTE — ED Notes (Signed)
Patient ambulatory to bathroom with steady gait.

## 2022-09-11 NOTE — ED Notes (Signed)
Pt ECG rhythm observed to be in normal sinus rhythm at this time. EKG performed. Dr. Eudelia Bunch notified.

## 2022-09-11 NOTE — ED Provider Notes (Signed)
7:36 AM Care assumed from Dr. Eudelia Bunch.  At time of transfer of care, patient awaiting for cardiology evaluation and recommendations.  Patient well-appearing on my initial assessment and has no complaints and appears to be in a sinus rhythm with rate in the 60s.  9:40 AM Cardiology came to the bedside and then came down with the plan.  They would like to give the patient a prescription to get started back on Eliquis and give him a tablet of metoprolol tartrate.  Patient will then follow-up with him in A-fib clinic and understands return precautions.  Patient discharged in good condition with well appearance.   Clinical Impression: 1. Atrial fibrillation with RVR (HCC)   2. Palpitations     Disposition: Discharge  Condition: Good  I have discussed the results, Dx and Tx plan with the pt(& family if present). He/she/they expressed understanding and agree(s) with the plan. Discharge instructions discussed at great length. Strict return precautions discussed and pt &/or family have verbalized understanding of the instructions. No further questions at time of discharge.    New Prescriptions   APIXABAN (ELIQUIS) 5 MG TABS TABLET    Take 1 tablet (5 mg total) by mouth 2 (two) times daily.    Follow Up: Regan Lemming, MD [address] [place] [address] [phone]        Latesia Norrington, Canary Brim, MD 09/11/22 1039

## 2022-09-11 NOTE — ED Triage Notes (Signed)
Pt. Arrives POV c/o a-fib. Pt. Has a cardiologist at [facility]. Pt. Woke up feeling clammy and feeling his heart beating irregularly.

## 2022-09-11 NOTE — Consult Note (Signed)
Cardiology Consultation   Patient ID: NOE GLOD MRN: [id number]; DOB: 1965/10/03  Admit date: 09/11/2022 Date of Consult: 09/11/2022  PCP:  Marden Noble, MD (Inactive)   [facility] Providers Cardiologist:  Tonny Bollman, MD      Patient Profile:   Mark Fowler is a 57 y.o. male with a hx of NSVT, atrial fibrillation who is being seen 09/11/2022 for the evaluation of afib, NSVT at the request of Dr. Rush Landmark.  History of Present Illness:   The patient is a 57 year old male with above medical history. Per chart review, patient underwent CT cardiac calcium scoring in 07/2020 that showed a coronary calcium score of 0. 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 Agitated saline contrast bubble study was negative. Cardiac event monitor from 02/2022 showed PVCs, a 28 beat wide-complex run that was suspicious for NSVT. Monitor also detected an episode of atrial fibrillation, and he was started on apixaban for anticoagulation. He was seen by Dr. Excell Seltzer in 03/2022, who ordered a coronary CTA and referred patient to EP. Coronary CTA from 03/21/22 showed calcium score 0, normal coronary arteries, normal ascending thoracic aorta.   Patient was seen by Dr. Elberta Fortis with EP on 04/05/22. At that appointment, patient expressed concerns about being on eliquis as he lived a very active live style and enjoyed mountain biking. He had a very short episode of afib, so Dr. Estill Dooms agreed to stop eliquis with plans for ILR implant. Dr. 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 Patient was referred to [facility] and Dr. Nolon Rod. He was seen by Dr. Regino Schultze on 07/20/22, who determined there was no indication for mitral valve intervention. He agreed with ILR.  Patient presented to the [facility] ED on 09/11/22 complaining that he could feel his heart beating irregularly. Initial vital signs showed HR 127 BPM, BP 149/107. Initial EKG showed sinus rhythm with frequent PACs/atrial bigeminy, HR 90 BPM.  Labs in the ED showed K 3.7, creatinine 0.95, mag 2.2, WBC 7.6, hemoglobin 17.4, platelets 224. Per review of telemetry, it appears that patient had brief episodes of afib with RVR mixed with episodes of sinus rhythm with frequent PVCs and PACs. He has since converted to NSR  Patient reports that in 01/2022, he was worried that he was starting to have some memory issues so he went to see a neurologist. His head MRI showed a lacunar infarct, that had been present on previous MRI from 2007. His neurologist had him undergo a full stroke workup, and his heart monitor showed a 40 minute episode of afib. He was started on apixaban. However, patient is very active and he likes to mountain bike, and he wanted to stop eliquis. He was seen by Dr. Elberta Fortis who agreed that he could stop eliquis. Planned for implantable loop recorder, but insurance issues have delayed the procedure. His resting HR is usually in the upper 40s overnight and in the low 50s during the day. Today, he woke up around 3 AM and he could feel his heart beating irregularly. Felt like his heart  was "thumping" in his chest. No chest pain, palpitations, shortness of breath, syncope, dizziness, near syncope. He tried to walk around his home, go up and down stairs, and drink cold water, but his HR did not return to normal. Symptoms persisted for about 3 hours, and he converted to NSR around 6AM.    Past Medical History:  Diagnosis Date   A-fib [facility])    Left inguinal hernia  06/06/2012   Repaired 07/04/12    Medical history non-contributory     Past Surgical History:  Procedure Laterality Date   HERNIA REPAIR  2007   rt ing hernia with mesh   INGUINAL HERNIA REPAIR Left 07/04/2012   Procedure: HERNIA REPAIR INGUINAL ADULT;  Surgeon: Currie Paris, MD;  Location: [facility];  Service: General;  Laterality: Left;   UPPER GI ENDOSCOPY       Home Medications:  Prior to Admission medications   Medication Sig Start Date End Date Taking? Authorizing Provider  B Complex-C-Folic Acid (FOLBEE PLUS) TABS Take 1 tablet by mouth daily. 05/08/22  Yes [provider]  Folic Acid-Vit B6-Vit B12 (FOLBEE) 2.5-25-1 MG TABS tablet Take 1 tablet by mouth daily. Patient not taking: Reported on 09/11/2022 01/31/22   Anson Fret, MD    Inpatient Medications: Scheduled Meds:  Continuous Infusions:  sodium chloride 125 mL/hr at 09/11/22 0631   PRN Meds:   Allergies:   No Known Allergies  Social History:   Social History   Socioeconomic History   Marital status: Married    Spouse name: Not on file   Number of children: Not on file   Years of education: Not on file   Highest education level: Not on file  Occupational History   Not on file  Tobacco Use   Smoking status: Never   Smokeless tobacco: Never  Substance and Sexual Activity   Alcohol use: Not Currently   Drug use: No   Sexual activity: Not on file  Other Topics Concern   Not on file  Social History Narrative   Not on file   Social Determinants of Health   Financial Resource Strain: Not on file  Food Insecurity: Not on file  Transportation Needs: Not on file  Physical Activity: Not on file  Stress: Not on file  Social Connections: Not on file  Intimate Partner Violence: Not on file    Family History:    Family History  Problem Relation Age of Onset   Alzheimer's disease Maternal Grandfather      ROS:  Please see the history of present illness.   All other  ROS reviewed and negative.     Physical Exam/Data:   Vitals:   09/11/22 0610 09/11/22 0615 09/11/22 0814 09/11/22 0833  BP:  129/87    Pulse: 63 63    Resp: (!) 22 20    Temp:   98 F (36.7 C)   TempSrc:   Oral   SpO2: 99% 100%    Weight:    76.9 kg  Height:    6' (1.829 m)    Intake/Output Summary (Last 24 hours) at 09/11/2022 0835 Last data filed at 09/11/2022 0639 Gross per 24 hour  Intake 1221.47 ml  Output 375 ml  Net 846.47 ml      09/11/2022    8:33 AM 04/05/2022   12:09 PM 03/15/2022   12:16 PM  Last 3 Weights  Weight (lbs) 169 lb 8.5 oz 169 lb 9.6 oz 164 lb  Weight (kg)  76.9 kg 76.93 kg 74.39 kg     Body mass index is 22.99 kg/m.  General:  Well nourished, well developed, in no acute distress. Sitting upright in the bed  HEENT: normal Neck: no JVD Vascular: Radial pulses 2+ bilaterally Cardiac:  normal S1, S2; RRR; no murmur  Lungs:  clear to auscultation bilaterally, no wheezing, rhonchi or rales. Normal work of breathing on room air  Abd: soft, nontender   Ext: no edema in BLE  Musculoskeletal:  No deformities, BUE and BLE strength normal and equal Skin: warm and dry  Neuro:  CNs 2-12 intact, no focal abnormalities noted Psych:  Normal affect   EKG:  The EKG was personally reviewed and demonstrates:  Initial EKG showed sinus rhythm with frequent PACs/atrial bigeminy. Most recent EKG shows normal sinus rhythm  Telemetry:  Telemetry was personally reviewed and demonstrates:  Patient was initially in afib with RVR, HR in the 120s. Appeared to have episodes of afib and episodes of sinus rhythm with frequent ectopy. Now in NSR   Relevant CV Studies: Cardiac Studies & Procedures       ECHOCARDIOGRAM  ECHOCARDIOGRAM COMPLETE BUBBLE STUDY 02/27/2022  Narrative ECHOCARDIOGRAM REPORT    Patient Name:   Mark Fowler Date of Exam: 02/27/2022 Medical Rec #:  [id number]      Height:       72.0 in Accession #:    [id number]     Weight:       164.5 lb Date of  Birth:  June 20, 1965      BSA:          1.961 m Patient Age:    56 years       BP:           148/88 mmHg Patient Gender: M              HR:           71 bpm. Exam Location:  Outpatient  Procedure: 2D Echo, Cardiac Doppler, Color Doppler, Saline Contrast Bubble Study and 3D Echo  Indications:     Stroke  History:         Patient has no prior history of Echocardiogram examinations. No significant PMH.  Sonographer:     Milda Smart Referring Phys:  [id number] Anson Fret Diagnosing Phys: Arvilla Meres MD   Sonographer Comments: Suboptimal apical window. Image acquisition challenging due to patient body habitus and Image acquisition challenging due to respiratory motion. IMPRESSIONS   1. Left ventricular ejection fraction, by estimation, is 60 to 65%. The left ventricle has normal function. The left ventricle has no regional wall motion abnormalities. Left ventricular diastolic parameters were normal. 2. Right ventricular systolic function is normal. The right ventricular size is normal. There is normal pulmonary artery systolic pressure. 3. The mitral valve is normal in structure. Mild mitral valve regurgitation. No evidence of mitral stenosis. There is moderate holosystolic prolapse of of the mitral valve. 4. The aortic valve is normal in structure. Aortic valve regurgitation is not visualized. No aortic stenosis is present. 5. The inferior vena cava is normal in size with greater than 50% respiratory variability, suggesting right atrial pressure of 3 mmHg. 6. Agitated saline contrast bubble study was negative, with no evidence of any interatrial shunt.  FINDINGS Left Ventricle: Left ventricular ejection fraction, by estimation, is 60 to 65%. The left ventricle has normal function. The left ventricle has no regional wall motion abnormalities. The left ventricular internal cavity size was normal in size.  There is no left ventricular hypertrophy. Left ventricular diastolic parameters  were normal.  Right Ventricle: The right ventricular size is normal. No increase in right ventricular wall thickness. Right ventricular systolic function is normal. There is normal pulmonary artery systolic pressure. The tricuspid regurgitant velocity is 1.81 m/s, and with an assumed right atrial pressure of 3 mmHg, the estimated right ventricular systolic pressure is 16.1 mmHg.  Left Atrium: Left atrial size was normal in size.  Right Atrium: Right atrial size was normal in size.  Pericardium: There is no evidence of pericardial effusion.  Mitral Valve: The mitral valve is normal in structure. There is moderate holosystolic prolapse of of the mitral valve. Mild mitral valve regurgitation. No evidence of mitral valve stenosis. MV peak gradient, 2.7 mmHg. The mean mitral valve gradient is 1.0 mmHg.  Tricuspid Valve: The tricuspid valve is normal in structure. Tricuspid valve regurgitation is trivial. No evidence of tricuspid stenosis.  Aortic Valve: The aortic valve is normal in structure. Aortic valve regurgitation is not visualized. No aortic stenosis is present.  Pulmonic Valve: The pulmonic valve was normal in structure. Pulmonic valve regurgitation is trivial. No evidence of pulmonic stenosis.  Aorta: The aortic root is normal in size and structure.  Venous: The inferior vena cava is normal in size with greater than 50% respiratory variability, suggesting right atrial pressure of 3 mmHg.  IAS/Shunts: No atrial level shunt detected by color flow Doppler. Agitated saline contrast was given intravenously to evaluate for intracardiac shunting. Agitated saline contrast bubble study was negative, with no evidence of any interatrial shunt.   LEFT VENTRICLE PLAX 2D LVIDd:         4.90 cm   Diastology LVIDs:         3.40 cm   LV e' medial:    7.29 cm/s LV PW:         1.10 cm   LV E/e' medial:  8.4 LV IVS:        1.00 cm   LV e' lateral:   9.68 cm/s LVOT diam:     2.10 cm   LV E/e'  lateral: 6.3 LV SV:         89 LV SV Index:   45 LVOT Area:     3.46 cm   RIGHT VENTRICLE             IVC RV S prime:     17.60 cm/s  IVC diam: 2.00 cm TAPSE (M-mode): 2.2 cm  LEFT ATRIUM           Index        RIGHT ATRIUM           Index LA diam:      3.50 cm 1.79 cm/m   RA Area:     19.30 cm LA Vol (A2C): 53.6 ml 27.34 ml/m  RA Volume:   57.20 ml  29.17 ml/m LA Vol (A4C): 23.9 ml 12.19 ml/m AORTIC VALVE LVOT Vmax:   136.00 cm/s LVOT Vmean:  88.900 cm/s LVOT VTI:    0.257 m  AORTA Ao Root diam: 3.30 cm Ao Asc diam:  3.60 cm  MITRAL VALVE               TRICUSPID VALVE MV Area (PHT): 2.36 cm    TR Peak grad:   13.1 mmHg MV Area VTI:   2.93 cm    TR Vmax:        181.00 cm/s MV Peak grad:  2.7 mmHg MV  Mean grad:  1.0 mmHg    SHUNTS MV Vmax:       0.82 m/s    Systemic VTI:  0.26 m MV Vmean:      51.3 cm/s   Systemic Diam: 2.10 cm MV Decel Time: 321 msec MV E velocity: 61.40 cm/s MV A velocity: 51.10 cm/s MV E/A ratio:  1.20  Arvilla Meres MD Electronically signed by Arvilla Meres MD Signature Date/Time: 02/27/2022/3:29:44 PM    Final (Updated)    MONITORS  CARDIAC EVENT MONITOR 03/03/2022   CT SCANS  CT CORONARY MORPH W/CTA COR W/SCORE 03/21/2022  Addendum 03/21/2022  6:56 PM ADDENDUM REPORT: 03/21/2022 12:57   Electronically Signed By: Charlton Haws M.D. On: 03/21/2022 12:57  Addendum 03/21/2022  6:56 PM ADDENDUM REPORT: 03/21/2022 10:22  EXAM: OVER-READ INTERPRETATION  CT CHEST  The following report is a limited chest CT over-read performed by radiologist Dr. Irma Newness [facility] on 03/21/2022. This over-read does not include interpretation of cardiac or coronary anatomy or pathology. The coronary CTA interpretation by the cardiologist is attached.  COMPARISON:  Cardiac CT 07/12/2020  FINDINGS: Mediastinum/Nodes: No enlarged lymph nodes within the visualized mediastinum.  Lungs/Pleura: There is no pleural  effusion. The visualized lungs appear clear.  Upper abdomen: No significant findings in the visualized upper abdomen.  Musculoskeletal/Chest wall: No chest wall mass or suspicious osseous findings within the visualized chest. 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  IMPRESSION: No significant extracardiac findings within the visualized chest.   Electronically Signed By: Carey Bullocks M.D. On: 03/21/2022 10:22  Narrative CLINICAL DATA:  Chest pain  EXAM: Cardiac CTA  MEDICATIONS: Sub lingual nitro.  4mg   TECHNIQUE: The patient was scanned on a Siemens Force 192 slice scanner. Gantry rotation speed was 250 msecs. Collimation was .6 mm. A 70 kV prospective scan was triggered in the ascending thoracic aorta at 140 HU's Full mA was used between 35% and 75% of the R-R interval. Average HR during the scan was 65 bpm. The 3D data set was interpreted on a dedicated work station using MPR, MIP and VRT modes. A total of 80 cc of contrast was used.  FINDINGS: Non-cardiac: See separate report from [facility]. No significant findings on limited lung and soft tissue windows.  Calcium Score: No calcium noted  Coronary Arteries: Right dominant with no anomalies  LM: Normal  LAD: Normal  IM: Small and normal  D1: Normal  D2: Normal  Circumflex: Normal  OM1: Normal  OM2: Normal  RCA: Normal  PDA: Normal  PLA: Normal  IMPRESSION: 1. Calcium score 0  2.  Normal right dominant coronary arteries  3.  Normal ascending thoracic aorta 3.5 cm  Charlton Haws  Electronically Signed: By: Charlton Haws M.D. On: 03/21/2022 09:45   CARDIAC MRI  MR CARDIAC MORPHOLOGY W WO CONTRAST 05/10/2022  Narrative CLINICAL DATA:  Ventricular tachycardia (VT), nonsustained  COMPARISON: Echocardiogram 02/27/22, Coronary CT angiogram 03/21/22  EXAM: MR CARDIA MORPHOLOGY WITHOUT AND WITH CONTRAST; MR CARDIAC VELOCITY FLOW MAPPING  TECHNIQUE: The  patient was scanned on a 1.5 Tesla GE magnet. A dedicated cardiac coil was used. Functional imaging was done using Fiesta sequences. 2,3, and 4 chamber views were done to assess for RWMA's. Modified Simpson's rule using a short axis stack was used to calculate an ejection fraction on a dedicated work [employer]. The patient received 8mL GADAVIST GADOBUTROL 1 MMOL/ML IV SOLN. After 10 minutes inversion recovery sequences were used to assess  for infiltration and scar tissue. Phase contrast velocity encoded images obtained x 2, above the aortic and pulmonary valves.  This examination is tailored for evaluation cardiac anatomy and function and provides very limited assessment of noncardiac structures, which are accordingly not evaluated during interpretation. If there is clinical concern for extracardiac pathology, further evaluation with CT imaging should be considered.  FINDINGS: LEFT VENTRICLE:  Normal left ventricular chamber size.  Normal left ventricular wall thickness.  Normal left ventricular systolic function.  LVEF = 59%  There are no regional wall motion abnormalities.  No myocardial edema visually noted.  Normal first pass perfusion.  There is post contrast delayed myocardial enhancement: There is delayed enhancement in the posteromedial papillary muscle (series 50/images 9-10, series 68). There is also a small focus of subtle mid myocardial LGE in the basal lateral wall (series 50/image 4, series 62).  Normal T1 myocardial nulling kinetics suggest against a diagnosis of cardiac amyloidosis.  ECV = 28%  RIGHT VENTRICLE:  Normal right ventricular chamber size.  Normal right ventricular wall thickness.  Normal right ventricular systolic function.  RVEF = 56%  There are no regional wall motion abnormalities.  No post contrast delayed myocardial enhancement.  ATRIA:  Normal left atrial size.  Normal right atrial  size.  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 Disjunction distance is 2.5 mm (best seen in series 8, frame 7).  Quantitation:  MR: Regurgitant fraction 16%, mild MR.  TR: Regurgitant fraction 16%, mild TR  AI: Regurgitant fraction 3%, trivial AI.  Tricuspid aortic valve.  PI: No pulmonary valve regurgitation  PERICARDIUM:  Normal pericardium.  No pericardial effusion.  OTHER: No significant extracardiac findings.  MEASUREMENTS: Qp/Qs: 1  Left ventricle:  LV male  LV EF: 59% (normal 56-78%)  Absolute volumes:  LV EDV: 178 mL (Normal 77-195 mL)  LV ESV: 73 mL (Normal 19-72 mL)  LV SV: 105 mL (Normal 51-133 mL)  CO: 5.7 L/min (Normal 2.8-8.8 L/min)  Indexed volumes:  LV EDV: 90 mL/sq-m (Normal 47-92 mL/sq-m)  LV ESV: 37 mL/sq-m (Normal 13-30 mL/sq-m)  LV SV: 53 mL/sq-m (Normal 32-62 mL/sq-m)  CI: 2.91 L/min/sq-m (Normal 1.7-4.2 L/min/sq-m)  Right ventricle:  RV male  RV EF: 56 % (Normal 47-74%)  Absolute volumes:  RV EDV: 182 mL (Normal 88-227 mL)  RV ESV: 80 mL (Normal 23-103 mL)  RV SV: 102 mL (Normal 52-138 mL)  CO: 5.6 L/min (Normal 2.8-8.8 L/min)  Indexed volumes:  RV EDV: 92 mL/sq-m (Normal 55-105 mL/sq-m)  RV ESV: 40 mL/sq-m (Normal 15-43 mL/sq-m)  RV SV: 52 mL/sq-m (Normal 32-64 mL/sq-m)  CI: 2.82 L/min/sq-m (Normal 1.7-4.2 L/min/sq-m)  IMPRESSION: 1. 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 Disjunction distance 2.5 mm. Mild mitral valve regurgitation, 16% regurgitant fraction.  2. There is post contrast delayed enhancement in the posteromedial papillary muscle (series 50/images 9-10, series 68). There is also a small focus of subtle mid myocardial LGE in the basal lateral wall (series 50/image 4, series 62).  3.  Normal left ventricular chamber size and function, LVEF 59%  4.  Normal right ventricular chamber size and function, RVEF 56%.   Electronically  Signed By: Weston Brass M.D. On: 05/10/2022 12:30          Laboratory Data:  High Sensitivity Troponin:  No results for input(s): "TROPONINIHS" in the last 720 hours.   Chemistry Recent Labs  Lab 09/11/22 0351  NA 142  K 3.7  CL 107  CO2 26  GLUCOSE 103*  BUN 20  CREATININE 0.95  CALCIUM 9.4  MG 2.2  GFRNONAA >60  ANIONGAP 9    No results for input(s): "PROT", "ALBUMIN", "AST", "ALT", "ALKPHOS", "BILITOT" in the last 168 hours. Lipids No results for input(s): "CHOL", "TRIG", "HDL", "LABVLDL", "LDLCALC", "CHOLHDL" in the last 168 hours.  Hematology Recent Labs  Lab 09/11/22 0351  WBC 7.6  RBC 5.56  HGB 17.4*  HCT 51.1  MCV 91.9  MCH 31.3  MCHC 34.1  RDW 12.2  PLT 224   Thyroid No results for input(s): "TSH", "FREET4" in the last 168 hours.  BNPNo results for input(s): "BNP", "PROBNP" in the last 168 hours.  DDimer No results for input(s): "DDIMER" in the last 168 hours.   Radiology/Studies:  DG Chest Port 1 View  Result Date: 09/11/2022 CLINICAL DATA:  Tachycardia EXAM: PORTABLE CHEST 1 VIEW COMPARISON:  None Available. FINDINGS: Normal heart size and mediastinal contours. No acute infiltrate or edema. No effusion or pneumothorax. No acute osseous findings. IMPRESSION: Negative portable chest. Electronically Signed   By: Tiburcio Pea M.D.   On: 09/11/2022 04:23     Assessment and Plan:   Paroxysmal Atrial Fibrillation  Frequent PVCs, PACs  - Patient previously wore a cardiac monitor in 02/2022 that showed less than 1% afib burden, longest episode 40 minutes. He was started on eliquis, but this was later discontinued by Dr. Elberta Fortis because it is believed that patient has a low afib burden and he lives a very active lifestyle (likes to mountain bike). Patient is in the process of scheduling ILR- had some issues with insurance coverage  - CHADS-VASc 2 due to patient's remote history of lacunar infarct  - Resting HR is in the 40s-50s. No BB or calcium  channel blocker  - Recommend follow up with EP to discuss possible antiarrythmics,  - Maintain K>4, mag>2   NSVT  Mitral Valve Prolapse with Mild MR  - On monitor from 02/2022, patient had 2 episodes of VT, longest lasting 28 beats  - Cardiac MRI from 05/2022 showed scar in the papillary muscle and mild mitral valve regurgitation/bileaflet mitral valve prolapse - Patient was seen by Dr. Regino Schultze with [facility] who did not think mitral valve intervention was necessary  - No NSVT noted on telemetry   Risk Assessment/Risk Scores:    CHA2DS2-VASc Score = 2   This indicates a 2.2% annual risk of stroke. The patient's score is based upon: CHF History: 0 HTN History: 0 Diabetes History: 0 Stroke History: 2 Vascular Disease History: 0 Age Score: 0 Gender Score: 0   For questions or updates, please contact [facility] Please consult [email or web address] for contact info under    Signed, Jonita Albee, PA-C  09/11/2022 8:35 AM    History and all data above reviewed.  Patient examined.  I agree with the findings as above.   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  He has had a lacunar infarct but not clear evidence of embolic phenomenon.  Still he has a CHA2DS2 - VASc score of 2.   He came in today because he has tachypalpitations starting last night.  He was in NSR with PACs on EKG but clearly has had runs of atrial fib and brief wide complex that is likely aberrant conduction.  He feels the palpitations but has had no chest pain, presyncope or syncope.  He is an avid athlete.  He denies chest pain or SOB.    The patient exam reveals  COR:RRR, no murmurs  ,  Lungs: Clear  ,  Abd: Positive bowel sounds, no rebound no guarding, Ext No edema  .  All available labs, radiology testing, previous records reviewed. Agree with documented assessment and plan.   Atrial fib:  He is having short paroxysms.  The plan is for him to have a loop implant.  I have suggested a pill in pocket 25 mg  metoprolol tartrate if he has sustained tachypalpitations.  Also, I have suggested going back on DOAC and he agrees.    I will discuss further with Dr. Elberta Fortis.  MR/MVP:  Mild and no therapy at this point.   BP:  Mildly elevated and given the previous infarct I have asked him to keep an eye on BP and suggest a [place] BP cuff.    Rollene Rotunda  9:38 AM  09/11/2022

## 2022-09-11 NOTE — ED Provider Notes (Signed)
[place] EMERGENCY DEPARTMENT AT [facility] Provider Note  CSN: [id number] Arrival date & time: 09/11/22 4098  Chief Complaint(s) Irregular Heart Beat  HPI Mark Fowler is a 57 y.o. male with a past medical history listed below including paroxysmal A-fib who presents to the emergency department for A-fib that began around 3 AM.  Patient noted rapid palpitations with mild chest tightness.  He denies any recent fevers or infections.  No cough or congestion.  No nausea or vomiting.  No diarrhea.  No dehydration.  Patient has not anticoagulated stating that he and his cardiologist decided to discontinue the Eliquis since he would only go into A-fib a very short period of time.  The history is provided by the patient.    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 Authorizing Provider  B Complex-C-Folic Acid (FOLBEE PLUS) TABS Take 1 tablet by mouth daily. 05/08/22  Yes [provider]  Folic Acid-Vit B6-Vit B12 (FOLBEE) 2.5-25-1 MG TABS tablet Take 1 tablet by mouth daily. Patient not taking: Reported on 09/11/2022 01/31/22   Anson Fret, MD                                                                                                                                    Allergies Patient has no known allergies.  Review of Systems Review of Systems As noted in HPI  Physical Exam Vital Signs  I have reviewed the triage vital signs BP 129/87   Pulse 63   Temp 97.8 F (36.6 C) (Oral)   Resp 20   SpO2 100%    Physical Exam Vitals reviewed.  Constitutional:      General: He is not in acute distress.    Appearance: He is well-developed. He is not diaphoretic.  HENT:     Head: Normocephalic and atraumatic.     Nose: Nose normal.  Eyes:     General: No scleral icterus.       Right eye: No discharge.        Left eye: No discharge.     Conjunctiva/sclera: Conjunctivae normal.     Pupils: Pupils are equal, round, and reactive to light.  Cardiovascular:     Rate and Rhythm: Tachycardia present. Rhythm irregularly irregular.     Heart sounds: Murmur heard.     Systolic murmur is present with a grade of 1/6.     No friction rub. No gallop.  Pulmonary:  Effort: Pulmonary effort is normal. No respiratory distress.     Breath sounds: Normal breath sounds. No stridor. No rales.  Abdominal:     General: There is no distension.     Palpations: Abdomen is soft.     Tenderness: There is no abdominal tenderness.  Musculoskeletal:        General: No tenderness.     Cervical back: Normal range of motion and neck supple.  Skin:    General: Skin is warm and dry.     Findings: No erythema or rash.  Neurological:     Mental Status: He is alert and oriented to person, place, and time.     ED Results and Treatments Labs (all labs ordered are listed, but only abnormal results are displayed) Labs Reviewed  BASIC METABOLIC PANEL - Abnormal; Notable for the following components:      Result Value   Glucose, Bld 103 (*)    All other components within normal limits  CBC - Abnormal; Notable for the following components:   Hemoglobin 17.4 (*)    All other components within normal limits  MAGNESIUM                                                                                                                         EKG    EKG Interpretation  Date/Time:  Monday September 11 2022 04:44:19 EDT Ventricular Rate:  88 PR Interval:  136 QRS Duration: 94 QT Interval:  376 QTC Calculation: 376 R  Axis:   -26 Text Interpretation: Sinus rhythm Multiform ventricular premature complexes Borderline left axis deviation Nonspecific T abnrm, anterolateral leads Confirmed by Drema Pry [phone]) on 09/11/2022 5:55:31 AM        EKG Interpretation  Date/Time:  Monday September 11 2022 06:05:16 EDT Ventricular Rate:  59 PR Interval:  152 QRS Duration: 89 QT Interval:  372 QTC Calculation: 369 R Axis:   -34 Text Interpretation: Sinus rhythm Left axis deviation Confirmed by Drema Pry [phone]) on 09/11/2022 6:25:14 AM        Radiology DG Chest Port 1 View  Result Date: 09/11/2022 CLINICAL DATA:  Tachycardia EXAM: PORTABLE CHEST 1 VIEW COMPARISON:  None Available. FINDINGS: Normal heart size and mediastinal contours. No acute infiltrate or edema. No effusion or pneumothorax. No acute osseous findings. IMPRESSION: Negative portable chest. Electronically Signed   By: Tiburcio Pea M.D.   On: 09/11/2022 04:23    Medications Ordered in ED Medications  sodium chloride 0.9 % bolus 1,000 mL (0 mLs Intravenous Stopped 09/11/22 0457)    Followed by  0.9 %  sodium chloride infusion ( Intravenous Infusion Verify 09/11/22 0631)  metoprolol tartrate (LOPRESSOR) injection 5 mg (5 mg Intravenous Given 09/11/22 0435)  potassium chloride 10 mEq in 100 mL IVPB ( Intravenous Infusion Verify 09/11/22 0639)  potassium chloride SA (KLOR-CON M) CR tablet 20 mEq (20 mEq Oral Given 09/11/22 0517)   Procedures .Critical Care  Performed by: Nira Conn, MD Authorized  by: Nira Conn, MD   Critical care provider statement:    Critical care time (minutes):  45   Critical care time was exclusive of:  Separately billable procedures and treating other patients   Critical care was necessary to treat or prevent imminent or life-threatening deterioration of the following conditions:  Cardiac failure   Critical care was time spent personally by me on the following activities:  Development of  treatment plan with patient or surrogate, discussions with consultants, evaluation of patient's response to treatment, examination of patient, obtaining history from patient or surrogate, review of old charts, re-evaluation of patient's condition, pulse oximetry, ordering and review of radiographic studies, ordering and review of laboratory studies and ordering and performing treatments and interventions   (including critical care time) Medical Decision Making / ED Course   Medical Decision Making Amount and/or Complexity of Data Reviewed Labs: ordered. Decision-making details documented in ED Course. Radiology: ordered and independent interpretation performed. Decision-making details documented in ED Course. ECG/medicine tests: ordered and independent interpretation performed. Decision-making details documented in ED Course.  Risk Prescription drug management. Decision regarding hospitalization.    Patient presents in A-fib RVR No known triggers. He is hemodynamically stable  Will provide patient with IV hydration, assess for evidence of anemia, electrolyte derangements or metabolic derangements.  CBC without leukocytosis.  Hemoglobin elevated and blood hemoconcentrated, possible mild dehydration. BMP without significant electrolyte derangement.  Potassium is at 3.7 but blood was hemolyzed.  Possibly below normal.  No renal insufficiency.  After 500 cc of IV fluids, patient still having intermittent A-fib.  Patient also noted to have intermittent bigeminy during his normal sinus stages on telemetry.  Patient was given 5 mg of Lopressor.  At 15 minutes, patient was evaluated again and noted improved heart rate with mostly normal sinus rhythm and bigeminy with intermittent A-fib.  Patient was given oral dose of potassium and IV repletion given the possible hypokalemia.  Patient is not a candidate for emergent cardioversion at this time given the fact that he is in and out of normal  sinus and A-fib.  I consulted cardiology and spoke with Dr. Park Breed, who agreed.  He recommended admitting patient if his atrial fibrillation persists and have EP MDs evaluate.  If patient goes into normal sinus rhythm, they recommended discharging him on diltiazem, 30 mg 4 times daily as needed and restarting his Eliquis.  0555 AM: On telemetry patient is noted to be in intermittent A-fib with rates between the 110s to 130s.  He is going in and out of normal sinus rhythm and also having PACs, PVCs and nonsustained V. tach.  I will consult cardiology again to discuss possible admission.Marland Kitchen  1610 AM: Patient now in normal sinus rhythm with rates in the 50s to 60s after voiding.  Still awaiting cardiology call  7:18 AM Still in NSR. Paging cardiology again.  7:37 AM I spoke with Dr. Gaynelle Arabian from cardiology who will send the team to evaluate the patient in telemetry.  They will provide recommendations.  Patient care turned over to oncoming provider. Patient case and results discussed in detail; please see their note for further ED managment.         Final Clinical Impression(s) / ED Diagnoses Final diagnoses:  Atrial fibrillation with RVR (HCC)    This chart was dictated using voice recognition software.  Despite best efforts to proofread,  errors can occur which can change the documentation meaning.    Nira Conn, MD 09/11/22 [phone]

## 2022-09-27 ENCOUNTER — Ambulatory Visit: Payer: BC Managed Care – PPO | Attending: Cardiology | Admitting: Cardiology

## 2022-09-27 ENCOUNTER — Encounter: Payer: Self-pay | Admitting: Cardiology

## 2022-09-27 VITALS — BP 108/70 | HR 68 | Ht 72.0 in | Wt 164.0 lb

## 2022-09-27 DIAGNOSIS — I472 Ventricular tachycardia, unspecified: Secondary | ICD-10-CM

## 2022-09-27 DIAGNOSIS — D6869 Other thrombophilia: Secondary | ICD-10-CM

## 2022-09-27 DIAGNOSIS — I4819 Other persistent atrial fibrillation: Secondary | ICD-10-CM

## 2022-09-27 DIAGNOSIS — I480 Paroxysmal atrial fibrillation: Secondary | ICD-10-CM

## 2022-09-27 MED ORDER — METOPROLOL TARTRATE 25 MG PO TABS: 25.0000 mg | ORAL_TABLET | Freq: Two times a day (BID) | ORAL | 2 refills | Status: AC

## 2022-09-27 NOTE — Progress Notes (Signed)
Electrophysiology Office Note:   Date:  09/27/2022  ID:  Mark Fowler, DOB 1965/07/11, MRN [id number]  Primary Cardiologist: Tonny Bollman, MD Electrophysiologist: Regan Lemming, MD   History of Present Illness:   Mark Fowler is a 57 y.o. male with h/o atrial fibrillation seen today for routine electrophysiology followup.  Since last being seen in our clinic the patient reports doing well.  He has no chest pain or shortness of breath currently.  He did go to the emergency room as below with palpitations.  He has not had palpitations since that time..  he denies chest pain, palpitations, dyspnea, PND, orthopnea, nausea, vomiting, dizziness, syncope, edema, weight gain, or early satiety.     He presented to cardiology clinic after a brain MRI showed remote cerebellar lacunar infarcts, present since 2007.  He wore a cardiac monitor that showed a 28 beat of wide-complex tachycardia suspicious for VT as well as atrial fibrillation.  He was started on Eliquis.  He is an avid cyclist and thus Eliquis was stopped.  Cardiac MRI showed mitral disjunction and he was referred to [facility] who determined that there is no acute intervention necessary.  He presented to [facility] emergency room 09/11/2022 complaining of palpitations.  He was found to have both atrial fibrillation and a high PAC burden.  He was started on Eliquis.     Review of systems complete and found to be negative unless listed in HPI.   Studies Reviewed:    EKG is not ordered today. EKG from 09/11/22 reviewed which showed atrial fibrillation        Risk Assessment/Calculations:    CHA2DS2-VASc Score = 2   This indicates a 2.2% annual risk of stroke. The patient's score is based upon: CHF History: 0 HTN History: 0 Diabetes History: 0 Stroke History: 2 Vascular Disease History: 0 Age Score: 0 Gender Score: 0             Physical Exam:   VS:  BP 108/70   Pulse 68   Ht 6' (1.829 m)   Wt 164 lb (74.4 kg)   SpO2  98%   BMI 22.24 kg/m    Wt Readings from Last 3 Encounters:  09/27/22 164 lb (74.4 kg)  09/11/22 169 lb 8.5 oz (76.9 kg)  04/05/22 169 lb 9.6 oz (76.9 kg)     GEN: Well nourished, well developed in no acute distress NECK: No JVD; No carotid bruits CARDIAC: Regular rate and rhythm, no murmurs, rubs, gallops RESPIRATORY:  Clear to auscultation without rales, wheezing or rhonchi  ABDOMEN: Soft, non-tender, non-distended EXTREMITIES:  No edema; No deformity   ASSESSMENT AND PLAN:    1.  Persistent atrial fibrillation/PACs: Presented to the emergency room with episode of atrial fibrillation.  Has a history of CVA.  On Eliquis.  As he has had a CVA, this was many years ago and quite remote.  It is certainly possible that this is not related to his atrial fibrillation.  Mark Fowler plan for ILR monitor for atrial fibrillation burden and to see if he needs to continue his Eliquis.  Risk and benefits were discussed.  Risk of bleeding and infection.  He understands the risks and is agreed to the procedure.  In the interim, we Mark Fowler prescribe metoprolol 25 mg twice daily as needed for atrial fibrillation.  2.  Nonsustained VT with mitral disjunction: No acute intervention necessary.  Continue to monitor.  3.  Secondary hypercoagulable state: Currently on Eliquis for atrial fibrillation  Follow up with Dr. Elberta Fortis in 6 months  Signed, Mark Linquist Jorja Loa, MD   SURGEON:  Mark Hernan Jorja Loa, MD     PREPROCEDURE DIAGNOSIS:  Cryptogenic stroke    POSTPROCEDURE DIAGNOSIS: Cryptogenic stroke     PROCEDURES:   1. Implantable loop recorder implantation    INTRODUCTION:  Mark Fowler presents with a history of cryptogenic stroke The costs of loop recorder monitoring have been discussed with the patient.    DESCRIPTION OF PROCEDURE:  Informed written consent was obtained.   Time Out Completed with RN    The patient required no sedation for the procedure today.  Mapping over the patient's chest was  performed to identify the area where electrograms were most prominent for ILR recording.  This area was found to be the left parasternal region over the 4th intercostal space. The patients left chest was therefore prepped and draped in the usual sterile fashion. The skin overlying the left parasternal region was infiltrated with lidocaine for local analgesia.  A 0.5-cm incision was made over the left parasternal region over the 3rd intercostal space.  A subcutaneous ILR pocket was fashioned using a combination of sharp and blunt dissection.  A Medtronic Reveal LINQ 2 implantable loop recorder (serial # [id number]) was then placed into the pocket  R waves were very prominent and measuring  0.2 .  Steri- Strips and a sterile dressing were then applied.  There were no early apparent complications.     CONCLUSIONS:   1. Successful implantation of a implantable loop recorder for a history of cryptogenic stroke  2. No early apparent complications.   Mark Fowler Jorja Loa, MD  Cardiac Electrophysiology

## 2022-09-27 NOTE — Patient Instructions (Addendum)
Medication Instructions:  Your physician has recommended you make the following change in your medication:  TAKE Metoprolol Tartrate (Lopressor) 25 mg twice daily as needed for afib/heart rates.  Labwork: None ordered.  Testing/Procedures: None ordered.  Follow-Up:  Your physician wants you to follow-up in: 6 months with Dr. Elberta Fortis.  You will receive a reminder letter in the mail two months in advance. If you don't receive a letter, please call our office to schedule the follow-up appointment.      Implantable Loop Recorder Placement, Care After This sheet gives you information about how to care for yourself after your procedure. Your health care provider may also give you more specific instructions. If you have problems or questions, contact your health care provider. What can I expect after the procedure? After the procedure, it is common to have: Soreness or discomfort near the incision. Some swelling or bruising near the incision.  Follow these instructions at home: Incision care  Monitor your cardiac device site for redness, swelling, and drainage. Call the device clinic at [phone] if you experience these symptoms or fever/chills.  Keep the large square bandage on your site for 24 hours and then you may remove it yourself. Keep the steri-strips underneath in place.   You may shower after 72 hours / 3 days from your procedure with the steri-strips in place. They will usually fall off on their own, or may be removed after 10 days. Pat dry.   Avoid lotions, ointments, or perfumes over your incision until it is well-healed.  Please do not submerge in water until your site is completely healed.   Your device is MRI compatible.   Remote monitoring is used to monitor your cardiac device from home. This monitoring is scheduled every month by our office. It allows [place] to keep an eye on the function of your device to ensure it is working properly.  If your wound site starts  to bleed apply pressure.    For help with the monitor please call Medtronic Monitor Support Specialist directly at [phone].    If you have any questions/concerns please call the device clinic at [phone].  Activity  Return to your normal activities.  General instructions Follow instructions from your health care provider about how to manage your implantable loop recorder and transmit the information. Learn how to activate a recording if this is necessary for your type of device. You may go through a metal detection gate, and you may let someone hold a metal detector over your chest. Show your ID card if needed. Do not have an MRI unless you check with your health care provider first. Take over-the-counter and prescription medicines only as told by your health care provider. Keep all follow-up visits as told by your health care provider. This is important. Contact a health care provider if: You have redness, swelling, or pain around your incision. You have a fever. You have pain that is not relieved by your pain medicine. You have triggered your device because of fainting (syncope) or because of a heartbeat that feels like it is racing, slow, fluttering, or skipping (palpitations). Get help right away if you have: Chest pain. Difficulty breathing. Summary After the procedure, it is common to have soreness or discomfort near the incision. Change your dressing as told by your health care provider. Follow instructions from your health care provider about how to manage your implantable loop recorder and transmit the information. Keep all follow-up visits as told by your health care provider.  This is important. This information is not intended to replace advice given to you by your health care provider. Make sure you discuss any questions you have with your health care provider. Document Released: 03/01/2015 Document Revised: 05/05/2017 Document Reviewed: 05/05/2017 Elsevier Patient  Education  2020 [employer].

## 2022-10-17 ENCOUNTER — Telehealth: Payer: Self-pay

## 2022-10-17 NOTE — Telephone Encounter (Signed)
Outreach made to Pt.  Discussed how loop recorder works and how it monitors.  All questions answered to Pt's satisfaction.  Advised he would receive monthly summation report via MyChart.  Sent screen shot of what that will look like, with today's transmission.  Await further needs.

## 2022-10-30 ENCOUNTER — Ambulatory Visit (INDEPENDENT_AMBULATORY_CARE_PROVIDER_SITE_OTHER): Payer: BC Managed Care – PPO

## 2022-10-30 DIAGNOSIS — I480 Paroxysmal atrial fibrillation: Secondary | ICD-10-CM

## 2022-10-30 LAB — CUP PACEART REMOTE DEVICE CHECK: Date Time Interrogation Session: 20240729080915

## 2022-11-13 ENCOUNTER — Encounter: Payer: Self-pay | Admitting: Cardiology

## 2022-11-14 ENCOUNTER — Other Ambulatory Visit: Payer: Self-pay

## 2022-11-14 MED ORDER — APIXABAN 5 MG PO TABS: 5.0000 mg | ORAL_TABLET | Freq: Two times a day (BID) | ORAL | 5 refills | Status: DC

## 2022-11-15 NOTE — Progress Notes (Signed)
Carelink Summary Report / Loop Recorder 

## 2022-12-05 ENCOUNTER — Ambulatory Visit (INDEPENDENT_AMBULATORY_CARE_PROVIDER_SITE_OTHER): Payer: BC Managed Care – PPO

## 2022-12-05 DIAGNOSIS — I480 Paroxysmal atrial fibrillation: Secondary | ICD-10-CM

## 2022-12-05 LAB — CUP PACEART REMOTE DEVICE CHECK: Date Time Interrogation Session: 20240831080934

## 2022-12-06 ENCOUNTER — Telehealth: Payer: Self-pay
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
## 2022-12-11 DIAGNOSIS — Z0000 Encounter for general adult medical examination without abnormal findings: Secondary | ICD-10-CM | POA: Diagnosis not present

## 2022-12-11 DIAGNOSIS — Z125 Encounter for screening for malignant neoplasm of prostate: Secondary | ICD-10-CM | POA: Diagnosis not present

## 2022-12-14 NOTE — Progress Notes (Signed)
Carelink Summary Report / Loop Recorder 

## 2022-12-19 ENCOUNTER — Ambulatory Visit: Payer: BC Managed Care – PPO | Admitting: Cardiology

## 2022-12-19 ENCOUNTER — Ambulatory Visit: Payer: BC Managed Care – PPO | Admitting: Student

## 2023-01-08 ENCOUNTER — Ambulatory Visit (INDEPENDENT_AMBULATORY_CARE_PROVIDER_SITE_OTHER): Payer: BC Managed Care – PPO

## 2023-01-08 DIAGNOSIS — I480 Paroxysmal atrial fibrillation: Secondary | ICD-10-CM

## 2023-01-08 LAB — CUP PACEART REMOTE DEVICE CHECK: Date Time Interrogation Session: 20241006230601

## 2023-01-22 NOTE — Progress Notes (Signed)
Carelink Summary Report / Loop Recorder 

## 2023-02-12 ENCOUNTER — Ambulatory Visit: Payer: BC Managed Care – PPO

## 2023-02-12 DIAGNOSIS — I480 Paroxysmal atrial fibrillation: Secondary | ICD-10-CM

## 2023-02-12 DIAGNOSIS — I472 Ventricular tachycardia, unspecified: Secondary | ICD-10-CM

## 2023-02-13 LAB — CUP PACEART REMOTE DEVICE CHECK: Date Time Interrogation Session: 20241108230925

## 2023-03-05 ENCOUNTER — Telehealth: Payer: Self-pay
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
## 2023-03-05 NOTE — Telephone Encounter (Signed)
Alert received from [employer]:  Alert remote transmission: AF and tachy There were 7 AF events detected,, AF burden is 0.1%, longest episode was 36 minutes, on OAC, fast ventricular rates with AF. There were 6 tachy episodes that were 194-200 bpm and lasted 6-36 seconds, sent to triage   Per last documentation-Pt does not take scheduled lopressor d/t slow heart rates.

## 2023-03-08 NOTE — Progress Notes (Signed)
Carelink Summary Report / Loop Recorder 

## 2023-03-18 LAB — CUP PACEART REMOTE DEVICE CHECK: Date Time Interrogation Session: 20241213230844

## 2023-03-19 ENCOUNTER — Ambulatory Visit (INDEPENDENT_AMBULATORY_CARE_PROVIDER_SITE_OTHER): Payer: BC Managed Care – PPO

## 2023-03-19 DIAGNOSIS — I480 Paroxysmal atrial fibrillation: Secondary | ICD-10-CM
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
## 2023-04-23 ENCOUNTER — Ambulatory Visit (INDEPENDENT_AMBULATORY_CARE_PROVIDER_SITE_OTHER): Payer: BC Managed Care – PPO

## 2023-04-23 DIAGNOSIS — I480 Paroxysmal atrial fibrillation: Secondary | ICD-10-CM | POA: Diagnosis not present

## 2023-04-23 LAB — CUP PACEART REMOTE DEVICE CHECK: Date Time Interrogation Session: 20250119232408

## 2023-04-23 NOTE — Progress Notes (Signed)
Carelink Summary Report / Loop Recorder 

## 2023-04-24 ENCOUNTER — Ambulatory Visit: Payer: BC Managed Care – PPO | Attending: Cardiology | Admitting: Cardiology

## 2023-04-24 ENCOUNTER — Encounter: Payer: Self-pay | Admitting: Cardiology

## 2023-04-24 VITALS — BP 130/86 | HR 56 | Ht 72.0 in | Wt 169.8 lb

## 2023-04-24 DIAGNOSIS — I480 Paroxysmal atrial fibrillation: Secondary | ICD-10-CM

## 2023-04-24 DIAGNOSIS — D6869 Other thrombophilia: Secondary | ICD-10-CM | POA: Diagnosis not present

## 2023-04-24 DIAGNOSIS — I4729 Other ventricular tachycardia: Secondary | ICD-10-CM

## 2023-04-24 NOTE — Patient Instructions (Signed)
Medication Instructions:  Your physician recommends that you continue on your current medications as directed. Please refer to the Current Medication list given to you today.  *If you need a refill on your cardiac medications before your next appointment, please call your pharmacy*   Lab Work: None ordered.  If you have labs (blood work) drawn today and your tests are completely normal, you will receive your results only by: MyChart Message (if you have MyChart) OR A paper copy in the mail If you have any lab test that is abnormal or we need to change your treatment, we will call you to review the results.   Testing/Procedures: None ordered.    Follow-Up: At [facility], you and your health needs are our priority.  As part of our continuing mission to provide you with exceptional heart care, we have created designated Provider Care Teams.  These Care Teams include your primary Cardiologist (physician) and Advanced Practice Providers (APPs -  Physician Assistants and Nurse Practitioners) who all work together to provide you with the care you need, when you need it.  We recommend signing up for the patient portal called "MyChart".  Sign up information is provided on this After Visit Summary.  MyChart is used to connect with patients for Virtual Visits (Telemedicine).  Patients are able to view lab/test results, encounter notes, upcoming appointments, etc.  Non-urgent messages can be sent to your provider as well.   To learn more about what you can do with MyChart, go to [email or web address].    Your next appointment:   6 months with Dr Elberta Fortis

## 2023-04-24 NOTE — Progress Notes (Signed)
Electrophysiology Office Note:   Date:  04/24/2023  ID:  Mark Fowler, DOB 1965/09/08, MRN [id number]  Primary Cardiologist: Mark Bollman, MD Primary Heart Failure: None Electrophysiologist: Mark Mariner Jorja Loa, MD      History of Present Illness:   Mark Fowler is a 58 y.o. male with h/o atrial fibrillation, CVA seen today for routine electrophysiology followup.   Since last being seen in our clinic the patient reports doing well.  He continues to have episodes of atrial fibrillation, the burden is less than 1%.  He feels palpitations.  He is taking metoprolol which improved his symptoms.  He continues to be on Eliquis.  He hold his Eliquis when he does risky activities such as mountain and road biking.  Aside from that, he has no acute complaints.  he denies chest pain, palpitations, dyspnea, PND, orthopnea, nausea, vomiting, dizziness, syncope, edema, weight gain, or early satiety.   Review of systems complete and found to be negative unless listed in HPI.   Device History: Medtronic loop recorder implanted 09/27/2022 for Cryptogenic Stroke  EP Information / Studies Reviewed:    EKG is ordered today. Personal review as below.  EKG Interpretation Date/Time:  Tuesday April 24 2023 09:36:39 EST Ventricular Rate:  56 PR Interval:  144 QRS Duration:  84 QT Interval:  416 QTC Calculation: 401 R Axis:   38  Text Interpretation: Sinus bradycardia Septal infarct , age undetermined When compared with ECG of 11-Sep-2022 06:05, PREVIOUS ECG IS PRESENT Confirmed by Mark Fowler ([id number]) on 04/24/2023 9:43:20 AM     Risk Assessment/Calculations:    CHA2DS2-VASc Score = 2   This indicates a 2.2% annual risk of stroke. The patient's score is based upon: CHF History: 0 HTN History: 0 Diabetes History: 0 Stroke History: 2 Vascular Disease History: 0 Age Score: 0 Gender Score: 0             Physical Exam:   VS:  BP 130/86 (BP Location: Left Arm, Patient Position: Sitting,  Cuff Size: Normal)   Pulse (!) 56   Ht 6' (1.829 m)   Wt 169 lb 12.8 oz (77 kg)   SpO2 98%   BMI 23.03 kg/m    Wt Readings from Last 3 Encounters:  04/24/23 169 lb 12.8 oz (77 kg)  09/27/22 164 lb (74.4 kg)  09/11/22 169 lb 8.5 oz (76.9 kg)     GEN: Well nourished, well developed in no acute distress NECK: No JVD; No carotid bruits CARDIAC: Regular rate and rhythm, no murmurs, rubs, gallops RESPIRATORY:  Clear to auscultation without rales, wheezing or rhonchi  ABDOMEN: Soft, non-tender, non-distended EXTREMITIES:  No edema; No deformity   ILR Interrogation- reviewed in detail today,  See PACEART report  ASSESSMENT AND PLAN:    Cryptogenic Stroke s/p Medtronic Loop recorder Normal device function See Pace Art report No changes today  2.  Paroxysmal atrial fibrillation: Has a less than 1% burden.  His main issue is his Eliquis.  He does high risk activities such as mountain road biking, and Eliquis puts him at risk of bleeding if he has a fall.  He has been holding his Eliquis around the time of these activities.  Mark Fowler look into the possibility of watchman.  3.  Nonsustained VT with mitral valve disjunction: No intervention necessary.  Continue to monitor.  4.  Secondary hypercoagulable state: Currently on Eliquis for atrial fibrillation     Follow up with Mark Fowler in 6 months  Signed, Mark Fowler  Mark Fortis, MD

## 2023-05-28 ENCOUNTER — Ambulatory Visit (INDEPENDENT_AMBULATORY_CARE_PROVIDER_SITE_OTHER): Payer: BC Managed Care – PPO

## 2023-05-28 DIAGNOSIS — I472 Ventricular tachycardia, unspecified: Secondary | ICD-10-CM

## 2023-05-28 DIAGNOSIS — I480 Paroxysmal atrial fibrillation: Secondary | ICD-10-CM

## 2023-05-29 LAB — CUP PACEART REMOTE DEVICE CHECK: Date Time Interrogation Session: 20250223232218

## 2023-05-31 NOTE — Progress Notes (Signed)
 [facility] Summary Report / Loop Recorder

## 2023-07-02 ENCOUNTER — Ambulatory Visit (INDEPENDENT_AMBULATORY_CARE_PROVIDER_SITE_OTHER): Payer: BC Managed Care – PPO

## 2023-07-02 DIAGNOSIS — I480 Paroxysmal atrial fibrillation: Secondary | ICD-10-CM | POA: Diagnosis not present

## 2023-07-02 LAB — CUP PACEART REMOTE DEVICE CHECK: Date Time Interrogation Session: 20250330231833

## 2023-07-04 NOTE — Addendum Note (Signed)
 Addended by: Geralyn Flash D on: 07/04/2023 10:36 AM   Modules accepted: Orders

## 2023-07-04 NOTE — Progress Notes (Signed)
 [facility] Summary Report / Loop Recorder

## 2023-07-31 ENCOUNTER — Other Ambulatory Visit: Payer: Self-pay | Admitting: Cardiology

## 2023-07-31 NOTE — Telephone Encounter (Signed)
 Prescription refill request for Eliquis  received. Indication:afib Last office visit:1/25 Scr:0.95  6/24 Age: 58 Weight:77  kg  Prescription refilled

## 2023-08-06 ENCOUNTER — Ambulatory Visit (INDEPENDENT_AMBULATORY_CARE_PROVIDER_SITE_OTHER): Payer: BC Managed Care – PPO

## 2023-08-06 DIAGNOSIS — I480 Paroxysmal atrial fibrillation: Secondary | ICD-10-CM

## 2023-08-06 DIAGNOSIS — I472 Ventricular tachycardia, unspecified: Secondary | ICD-10-CM

## 2023-08-06 LAB — CUP PACEART REMOTE DEVICE CHECK: Date Time Interrogation Session: 20250504233047

## 2023-08-16 NOTE — Progress Notes (Signed)
 [facility] Summary Report / Loop Recorder

## 2023-08-26 ENCOUNTER — Other Ambulatory Visit: Payer: Self-pay | Admitting: Neurology

## 2023-08-30 ENCOUNTER — Other Ambulatory Visit: Payer: Self-pay | Admitting: *Deleted

## 2023-08-30 NOTE — Telephone Encounter (Signed)
 Pt last seen on 12/16/21 No follow up scheduled

## 2023-09-06 ENCOUNTER — Ambulatory Visit (INDEPENDENT_AMBULATORY_CARE_PROVIDER_SITE_OTHER)

## 2023-09-06 DIAGNOSIS — I480 Paroxysmal atrial fibrillation: Secondary | ICD-10-CM | POA: Diagnosis not present

## 2023-09-06 LAB — CUP PACEART REMOTE DEVICE CHECK: Date Time Interrogation Session: 20250604232825

## 2023-09-14 ENCOUNTER — Ambulatory Visit: Payer: Self-pay | Admitting: Cardiology

## 2023-09-24 NOTE — Progress Notes (Signed)
 [facility] Summary Report / Loop Recorder

## 2023-10-08 ENCOUNTER — Ambulatory Visit

## 2023-10-08 DIAGNOSIS — I480 Paroxysmal atrial fibrillation: Secondary | ICD-10-CM | POA: Diagnosis not present

## 2023-10-08 LAB — CUP PACEART REMOTE DEVICE CHECK: Date Time Interrogation Session: 20250706233651

## 2023-10-24 NOTE — Progress Notes (Signed)
 [facility] Summary Report / Loop Recorder

## 2023-11-03 ENCOUNTER — Ambulatory Visit: Payer: Self-pay | Admitting: Cardiology

## 2023-11-08 ENCOUNTER — Ambulatory Visit

## 2023-11-08 ENCOUNTER — Ambulatory Visit: Payer: Self-pay | Admitting: Cardiology

## 2023-11-08 DIAGNOSIS — I480 Paroxysmal atrial fibrillation: Secondary | ICD-10-CM | POA: Diagnosis not present

## 2023-11-08 LAB — CUP PACEART REMOTE DEVICE CHECK: Date Time Interrogation Session: 20250806232852

## 2023-12-10 ENCOUNTER — Ambulatory Visit: Payer: Self-pay | Admitting: Cardiology

## 2023-12-10 ENCOUNTER — Ambulatory Visit

## 2023-12-10 DIAGNOSIS — I480 Paroxysmal atrial fibrillation: Secondary | ICD-10-CM

## 2023-12-10 LAB — CUP PACEART REMOTE DEVICE CHECK: Date Time Interrogation Session: 20250906231732

## 2023-12-20 NOTE — Progress Notes (Signed)
 Remote Loop Recorder Transmission

## 2023-12-29 NOTE — Progress Notes (Signed)
 Remote Loop Recorder Transmission

## 2024-01-10 ENCOUNTER — Encounter

## 2024-01-11 ENCOUNTER — Ambulatory Visit

## 2024-01-11 DIAGNOSIS — I480 Paroxysmal atrial fibrillation: Secondary | ICD-10-CM

## 2024-01-11 LAB — CUP PACEART REMOTE DEVICE CHECK: Date Time Interrogation Session: 20251009233406

## 2024-01-13 ENCOUNTER — Ambulatory Visit: Payer: Self-pay | Admitting: Cardiology

## 2024-01-14 NOTE — Progress Notes (Signed)
 Remote Loop Recorder Transmission

## 2024-02-11 ENCOUNTER — Encounter

## 2024-02-11 ENCOUNTER — Ambulatory Visit

## 2024-02-11 DIAGNOSIS — I480 Paroxysmal atrial fibrillation: Secondary | ICD-10-CM | POA: Diagnosis not present

## 2024-02-11 LAB — CUP PACEART REMOTE DEVICE CHECK: Date Time Interrogation Session: 20251109233650

## 2024-02-12 ENCOUNTER — Ambulatory Visit: Payer: Self-pay | Admitting: Cardiology

## 2024-02-14 NOTE — Progress Notes (Signed)
 Remote Loop Recorder Transmission

## 2024-03-13 ENCOUNTER — Encounter

## 2024-03-15 ENCOUNTER — Ambulatory Visit: Attending: Cardiology

## 2024-03-15 DIAGNOSIS — I472 Ventricular tachycardia, unspecified: Secondary | ICD-10-CM | POA: Diagnosis not present

## 2024-03-17 ENCOUNTER — Ambulatory Visit: Payer: Self-pay | Admitting: Cardiology

## 2024-03-17 DIAGNOSIS — H5319 Other subjective visual disturbances: Secondary | ICD-10-CM | POA: Diagnosis not present

## 2024-03-17 DIAGNOSIS — Q120 Congenital cataract: Secondary | ICD-10-CM | POA: Diagnosis not present

## 2024-03-17 DIAGNOSIS — H43812 Vitreous degeneration, left eye: Secondary | ICD-10-CM | POA: Diagnosis not present

## 2024-03-17 DIAGNOSIS — H2513 Age-related nuclear cataract, bilateral: Secondary | ICD-10-CM | POA: Diagnosis not present

## 2024-03-17 LAB — CUP PACEART REMOTE DEVICE CHECK: Date Time Interrogation Session: 20251212232615

## 2024-03-21 NOTE — Progress Notes (Signed)
 Remote Loop Recorder Transmission

## 2024-04-13 ENCOUNTER — Encounter

## 2024-04-15 ENCOUNTER — Ambulatory Visit: Payer: Self-pay | Admitting: Cardiology

## 2024-04-15 ENCOUNTER — Ambulatory Visit

## 2024-04-15 DIAGNOSIS — I480 Paroxysmal atrial fibrillation: Secondary | ICD-10-CM

## 2024-04-15 LAB — CUP PACEART REMOTE DEVICE CHECK: Date Time Interrogation Session: 20260112232558

## 2024-04-18 NOTE — Progress Notes (Signed)
 Remote Loop Recorder Transmission

## 2024-05-09 ENCOUNTER — Emergency Department (HOSPITAL_BASED_OUTPATIENT_CLINIC_OR_DEPARTMENT_OTHER)
Admission: EM | Admit: 2024-05-09 | Discharge: 2024-05-09 | Disposition: A | Discharge status: Home / Self Care | Source: Home / Self Care

## 2024-05-09 ENCOUNTER — Emergency Department (HOSPITAL_BASED_OUTPATIENT_CLINIC_OR_DEPARTMENT_OTHER)

## 2024-05-09 ENCOUNTER — Other Ambulatory Visit: Payer: Self-pay

## 2024-05-09 ENCOUNTER — Encounter (HOSPITAL_BASED_OUTPATIENT_CLINIC_OR_DEPARTMENT_OTHER): Payer: Self-pay

## 2024-05-09 DIAGNOSIS — R079 Chest pain, unspecified: Secondary | ICD-10-CM

## 2024-05-09 LAB — BASIC METABOLIC PANEL WITH GFR
Anion gap: 15 (ref 5–15)
BUN: 16 mg/dL (ref 6–20)
CO2: 22 mmol/L (ref 22–32)
Calcium: 9.3 mg/dL (ref 8.9–10.3)
Chloride: 104 mmol/L (ref 98–111)
Creatinine, Ser: 0.9 mg/dL (ref 0.61–1.24)
GFR, Estimated: >60 mL/min
Glucose, Bld: 113 mg/dL — ABNORMAL HIGH (ref 70–99)
Potassium: 3.8 mmol/L (ref 3.5–5.1)
Sodium: 141 mmol/L (ref 135–145)

## 2024-05-09 LAB — CBC
HCT: 47 % (ref 39.0–52.0)
Hemoglobin: 16.4 g/dL (ref 13.0–17.0)
MCH: 31.1 pg (ref 26.0–34.0)
MCHC: 34.9 g/dL (ref 30.0–36.0)
MCV: 89.2 fL (ref 80.0–100.0)
Platelets: 239 10*3/uL (ref 150–400)
RBC: 5.27 MIL/uL (ref 4.22–5.81)
RDW: 12.4 % (ref 11.5–15.5)
WBC: 6.3 10*3/uL (ref 4.0–10.5)
nRBC: 0 % (ref 0.0–0.2)

## 2024-05-09 LAB — TROPONIN T, HIGH SENSITIVITY
Troponin T High Sensitivity: 8 ng/L (ref 0–19)
Troponin T High Sensitivity: 7 ng/L (ref 0–19)

## 2024-05-09 NOTE — ED Triage Notes (Signed)
 Crushing L sided chest pain radiating down L arm, nausea since for 1 hour.   Denies SHOB or emesis

## 2024-05-09 NOTE — Discharge Instructions (Signed)
 Your workup today was reassuring.  Please follow-up with cardiology.  You should receive a call in the next day or 2 regarding follow-up.  Return to the ER for exertional symptoms or worsening symptoms of any kind.

## 2024-05-09 NOTE — ED Provider Notes (Signed)
 " [place] [facility] Provider Note   CSN: [id number] Arrival date & time: 05/09/24  1116     Patient presents with: Chest Pain   Mark Fowler is a 59 y.o. male.   59 year old male with past medical history of atrial fibrillation presenting to the emergency department today with chest discomfort.  The patient states that this came on at 1030 this morning while he was at home working.  The patient states that he had a pressure sensation on the left side of his chest.  He did radiate down his left arm.  This lasted for a few minutes.  He states that he is feeling better now.  Flank pain was nonpleuritic.  He denies any shortness of breath.  He states that this has resolved now.  He came to the ER for further evaluation regarding this.  He does have some nausea but denies any diaphoresis.  Pain is nonexertional.   Chest Pain      Prior to Admission medications  Medication Sig Start Date End Date Taking? Authorizing Provider  ALPRAZolam (XANAX) 0.5 MG tablet Take 0.5-1 mg by mouth at bedtime.    [provider]  B Complex-C-Folic Acid  (FOLBEE PLUS) TABS Take 1 tablet by mouth daily. 05/08/22   [provider]  ELIQUIS  5 MG TABS tablet TAKE 1 TABLET(5 MG) BY MOUTH TWICE DAILY 07/31/23   Camnitz, Soyla Lunger, MD  Folic Acid -Vit B6-Vit B12 (FOLBEE) 2.5-25-1 MG TABS tablet Take 1 tablet by mouth daily. Patient not taking: Reported on 09/27/2022 01/31/22   Ines Onetha NOVAK, MD  metoprolol  tartrate (LOPRESSOR ) 25 MG tablet Take 1 tablet (25 mg total) by mouth 2 (two) times daily. 09/27/22   Camnitz, Soyla Lunger, MD    Allergies: Patient has no known allergies.    Review of Systems  Cardiovascular:  Positive for chest pain.  All other systems reviewed and are negative.   Updated Vital Signs BP (!) 143/102 (BP Location: Right Arm)   Pulse 65   Temp 97.9 F (36.6 C) (Oral)   Resp 15   Ht 6' (1.829 m)   Wt 77 kg   SpO2 98%   BMI  23.02 kg/m   Physical Exam Vitals and nursing note reviewed.   Gen: NAD Eyes: PERRL, EOMI HEENT: no oropharyngeal swelling Neck: trachea midline Resp: clear to auscultation bilaterally Card: RRR, no murmurs, rubs, or gallops Abd: nontender, nondistended Extremities: no calf tenderness, no edema Vascular: 2+ radial pulses bilaterally Neuro: No focal deficits Skin: no rashes Psyc: acting appropriately   (all labs ordered are listed, but only abnormal results are displayed) Labs Reviewed  BASIC METABOLIC PANEL WITH GFR - Abnormal; Notable for the following components:      Result Value   Glucose, Bld 113 (*)    All other components within normal limits  CBC  TROPONIN T, HIGH SENSITIVITY  TROPONIN T, HIGH SENSITIVITY    EKG: EKG Interpretation Date/Time:  Friday May 09 2024 11:25:18 EST Ventricular Rate:  78 PR Interval:  151 QRS Duration:  85 QT Interval:  371 QTC Calculation: 423 R Axis:   -53  Text Interpretation: Sinus rhythm Minimal ST depression, anterior leads Confirmed by Ula Barter [id number]) on 05/09/2024 11:33:24 AM  Radiology: No results found.   Procedures   Medications Ordered in the ED - No data to display  Medical Decision Making 59 year old male with past medical history of atrial fibrillation presenting to the emergency department today with left-sided chest pressure.  I will further evaluate the patient here with basic labs as well as an EKG and troponin to evaluate for ACS.  Will keep the patient on the cardiac monitor here to evaluate for arrhythmias.  Patient's vital signs are reassuring and based on description of his symptoms suspicion for pulmonary embolism is low.  With the symptoms that have since resolved and with reassuring neurovascular exam suspicion for aortic dissection is low at this time.  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 His EKG here is largely unchanged although it does  appear that there may be some very slight ST depressions in the anterior leads compared to previous.  This is initial workup is complete I will discuss case with cardiology.  The patient's workup here is reassuring.  I did discuss his case with Dr. Raford who did review the patient's EKG.  After reviewing his workup she recommends follow-up.  The patient is discharged with return precautions and is in agreement with this plan.  Amount and/or Complexity of Data Reviewed Labs: ordered. Radiology: ordered.        Final diagnoses:  Chest pain, unspecified type    ED Discharge Orders          Ordered    Ambulatory referral to Cardiology       Comments: If you have not heard from the Cardiology office within the next 72 hours please call [phone].   05/09/24 1439               Ula Prentice SAUNDERS, MD 05/09/24 1440  "

## 2024-05-09 NOTE — ED Notes (Signed)
 Report received from [place], [place]. Assuming patient care at this time.

## 2024-05-14 ENCOUNTER — Encounter

## 2024-05-16 ENCOUNTER — Ambulatory Visit

## 2024-05-20 ENCOUNTER — Ambulatory Visit: Admitting: General Practice
